# Patient Record
Sex: Male | Born: 1955 | Race: White | Hispanic: No | Marital: Married | Smoking: Light tobacco smoker
Health system: Southern US, Community
[De-identification: ages and names within clinical notes are randomized; demographics above are authoritative.]

## PROBLEM LIST (undated history)

## (undated) DIAGNOSIS — Z9119 Patient's noncompliance with other medical treatment and regimen: Secondary | ICD-10-CM

## (undated) DIAGNOSIS — K219 Gastro-esophageal reflux disease without esophagitis: Secondary | ICD-10-CM

## (undated) DIAGNOSIS — Z91199 Patient's noncompliance with other medical treatment and regimen due to unspecified reason: Secondary | ICD-10-CM

## (undated) DIAGNOSIS — R079 Chest pain, unspecified: Secondary | ICD-10-CM

## (undated) DIAGNOSIS — I10 Essential (primary) hypertension: Secondary | ICD-10-CM

## (undated) DIAGNOSIS — E785 Hyperlipidemia, unspecified: Secondary | ICD-10-CM

## (undated) HISTORY — PX: NO PAST SURGERIES: SHX2092

## (undated) HISTORY — DX: Gastro-esophageal reflux disease without esophagitis: K21.9

---

## 2004-03-24 ENCOUNTER — Ambulatory Visit: Payer: Self-pay | Admitting: Family Medicine

## 2004-05-18 ENCOUNTER — Ambulatory Visit: Payer: Self-pay | Admitting: Family Medicine

## 2004-06-21 ENCOUNTER — Ambulatory Visit: Payer: Self-pay | Admitting: Family Medicine

## 2004-09-20 ENCOUNTER — Ambulatory Visit: Payer: Self-pay | Admitting: Family Medicine

## 2009-09-21 ENCOUNTER — Telehealth: Payer: Self-pay | Admitting: Family Medicine

## 2010-04-20 NOTE — Progress Notes (Signed)
Summary: call  Phone Note Call from Patient   Caller: woman - vm Reason for Call: Talk to Doctor Summary of Call: Leaving message for Dr. Tawanna Cooler to call [phone].  Called him.  Vomited Sun & dizzy.  Dizziness continues.  He thinks it's inner ear.  Declines appointment.  Does not want to come to office without knowing what Dr. Karie Schwalbe would recommend.   Initial call taken by: Rudy Jew, RN,  September 21, 2009 3:13 PM

## 2013-10-16 ENCOUNTER — Encounter (HOSPITAL_COMMUNITY): Payer: Self-pay | Admitting: Emergency Medicine

## 2013-10-16 ENCOUNTER — Emergency Department (HOSPITAL_COMMUNITY): Payer: BC Managed Care – PPO

## 2013-10-16 ENCOUNTER — Observation Stay (HOSPITAL_COMMUNITY)
Admission: EM | Admit: 2013-10-16 | Discharge: 2013-10-17 | Disposition: A | Discharge status: Home / Self Care | Payer: BC Managed Care – PPO | Attending: Family Medicine | Admitting: Family Medicine

## 2013-10-16 DIAGNOSIS — I160 Hypertensive urgency: Secondary | ICD-10-CM

## 2013-10-16 DIAGNOSIS — E785 Hyperlipidemia, unspecified: Secondary | ICD-10-CM | POA: Diagnosis present

## 2013-10-16 DIAGNOSIS — R9431 Abnormal electrocardiogram [ECG] [EKG]: Secondary | ICD-10-CM

## 2013-10-16 DIAGNOSIS — Z79899 Other long term (current) drug therapy: Secondary | ICD-10-CM | POA: Diagnosis not present

## 2013-10-16 DIAGNOSIS — I10 Essential (primary) hypertension: Secondary | ICD-10-CM

## 2013-10-16 DIAGNOSIS — I200 Unstable angina: Secondary | ICD-10-CM

## 2013-10-16 DIAGNOSIS — R209 Unspecified disturbances of skin sensation: Secondary | ICD-10-CM | POA: Insufficient documentation

## 2013-10-16 DIAGNOSIS — R079 Chest pain, unspecified: Principal | ICD-10-CM

## 2013-10-16 DIAGNOSIS — G4700 Insomnia, unspecified: Secondary | ICD-10-CM | POA: Diagnosis not present

## 2013-10-16 DIAGNOSIS — I2089 Other forms of angina pectoris: Secondary | ICD-10-CM | POA: Insufficient documentation

## 2013-10-16 DIAGNOSIS — R0602 Shortness of breath: Secondary | ICD-10-CM | POA: Diagnosis not present

## 2013-10-16 DIAGNOSIS — Z9119 Patient's noncompliance with other medical treatment and regimen: Secondary | ICD-10-CM

## 2013-10-16 DIAGNOSIS — Z91199 Patient's noncompliance with other medical treatment and regimen due to unspecified reason: Secondary | ICD-10-CM

## 2013-10-16 DIAGNOSIS — R42 Dizziness and giddiness: Secondary | ICD-10-CM | POA: Insufficient documentation

## 2013-10-16 DIAGNOSIS — I208 Other forms of angina pectoris: Secondary | ICD-10-CM | POA: Insufficient documentation

## 2013-10-16 DIAGNOSIS — R0609 Other forms of dyspnea: Secondary | ICD-10-CM

## 2013-10-16 DIAGNOSIS — Z716 Tobacco abuse counseling: Secondary | ICD-10-CM

## 2013-10-16 HISTORY — DX: Patient's noncompliance with other medical treatment and regimen: Z91.19

## 2013-10-16 HISTORY — DX: Essential (primary) hypertension: I10

## 2013-10-16 HISTORY — DX: Hyperlipidemia, unspecified: E78.5

## 2013-10-16 HISTORY — DX: Chest pain, unspecified: R07.9

## 2013-10-16 HISTORY — DX: Patient's noncompliance with other medical treatment and regimen due to unspecified reason: Z91.199

## 2013-10-16 LAB — CBC
HEMATOCRIT: 45.1 % (ref 39.0–52.0)
Hemoglobin: 16.3 g/dL (ref 13.0–17.0)
MCH: 31 pg (ref 26.0–34.0)
MCHC: 36.1 g/dL — AB (ref 30.0–36.0)
MCV: 85.7 fL (ref 78.0–100.0)
Platelets: 191 10*3/uL (ref 150–400)
RBC: 5.26 MIL/uL (ref 4.22–5.81)
RDW: 12.5 % (ref 11.5–15.5)
WBC: 7.4 10*3/uL (ref 4.0–10.5)

## 2013-10-16 LAB — I-STAT TROPONIN, ED
Troponin i, poc: 0 ng/mL (ref 0.00–0.08)
Troponin i, poc: 0.01 ng/mL (ref 0.00–0.08)

## 2013-10-16 LAB — BASIC METABOLIC PANEL
Anion gap: 15 (ref 5–15)
BUN: 15 mg/dL (ref 6–23)
CO2: 24 mEq/L (ref 19–32)
CREATININE: 1 mg/dL (ref 0.50–1.35)
Calcium: 9.5 mg/dL (ref 8.4–10.5)
Chloride: 103 mEq/L (ref 96–112)
GFR calc Af Amer: >90 mL/min (ref >90)
GFR, EST NON AFRICAN AMERICAN: 80 mL/min — AB (ref >90)
GLUCOSE: 95 mg/dL (ref 70–99)
POTASSIUM: 3.5 meq/L — AB (ref 3.7–5.3)
Sodium: 142 mEq/L (ref 137–147)

## 2013-10-16 LAB — TROPONIN I

## 2013-10-16 LAB — PRO B NATRIURETIC PEPTIDE: Pro B Natriuretic peptide (BNP): 67.6 pg/mL (ref 0–125)

## 2013-10-16 MED ORDER — HYDRALAZINE HCL 20 MG/ML IJ SOLN: 5.0000 mg | INTRAMUSCULAR | Status: DC | PRN

## 2013-10-16 MED ORDER — NITROGLYCERIN 2 % TD OINT
1.0000 [in_us] | TOPICAL_OINTMENT | Freq: Four times a day (QID) | TRANSDERMAL | Status: DC
  Filled 2013-10-16: qty 30

## 2013-10-16 MED ORDER — METOPROLOL TARTRATE 25 MG PO TABS
25.0000 mg | ORAL_TABLET | Freq: Two times a day (BID) | ORAL | Status: DC
  Administered 2013-10-16: 25 mg via ORAL
  Filled 2013-10-16 (×3): qty 1

## 2013-10-16 MED ORDER — ENOXAPARIN SODIUM 40 MG/0.4ML ~~LOC~~ SOLN
40.0000 mg | Freq: Every day | SUBCUTANEOUS | Status: DC
  Administered 2013-10-16: 40 mg via SUBCUTANEOUS
  Filled 2013-10-16 (×2): qty 0.4

## 2013-10-16 MED ORDER — NITROGLYCERIN 2 % TD OINT
1.0000 [in_us] | TOPICAL_OINTMENT | Freq: Four times a day (QID) | TRANSDERMAL | Status: DC
  Filled 2013-10-16: qty 1

## 2013-10-16 MED ORDER — AMLODIPINE BESYLATE 10 MG PO TABS
10.0000 mg | ORAL_TABLET | Freq: Every day | ORAL | Status: DC
  Filled 2013-10-16: qty 1

## 2013-10-16 MED ORDER — LISINOPRIL 10 MG PO TABS
10.0000 mg | ORAL_TABLET | Freq: Every day | ORAL | Status: DC
  Filled 2013-10-16: qty 1

## 2013-10-16 MED ORDER — ASPIRIN 81 MG PO CHEW
324.0000 mg | CHEWABLE_TABLET | Freq: Once | ORAL | Status: AC
  Administered 2013-10-16: 324 mg via ORAL
  Filled 2013-10-16: qty 4

## 2013-10-16 NOTE — ED Provider Notes (Signed)
CSN: [id number]     Arrival date & time 10/16/13  1734 History   First MD Initiated Contact with Patient 10/16/13 1822     Chief Complaint  Patient presents with  . Chest Pain    HPI Pt noticed that his blood pressure was elevated when he woke up this morning.  He took his regular medications.  As the day progressed he took his blood pressure again and it was elevated.  He then noticed a discomfort in the left axillary region around 4pm.  He decided to take an old blood pressure medication, amlodipine but that still didn't help.  He started to feel a little short of breath as well.  He has noticed some decreased exercise tolerance today but generally walks regularly and has no discomfort associated with that.    No history of heart disease in the immediate family. Past Medical History  Diagnosis Date  . Hypertension    History reviewed. No pertinent past surgical history. History reviewed. No pertinent family history. History  Substance Use Topics  . Smoking status: Never Smoker   . Smokeless tobacco: Not on file  . Alcohol Use: 4.2 oz/week    7 Glasses of wine per week    Review of Systems  Neurological: Positive for light-headedness.       Some numbness left face   All other systems reviewed and are negative.     Allergies  Review of patient's allergies indicates no known allergies.  Home Medications   Prior to Admission medications   Medication Sig Start Date End Date Taking? Authorizing Provider  lisinopril (PRINIVIL,ZESTRIL) 10 MG tablet Take 10 mg by mouth daily.   Yes Historical Provider, MD  Multiple Vitamins-Minerals (MULTIVITAMIN PO) Take 1 tablet by mouth daily.   Yes Historical Provider, MD   BP 181/100  Pulse 65  Temp(Src) 97.6 F (36.4 C) (Oral)  Resp 13  Ht 5\' 7"  (1.702 m)  Wt 184 lb (83.462 kg)  BMI 28.81 kg/m2  SpO2 95% Physical Exam  Nursing note and vitals reviewed. Constitutional: He is oriented to person, place, and time. He appears  well-developed and well-nourished. No distress.  HENT:  Head: Normocephalic and atraumatic.  Right Ear: External ear normal.  Left Ear: External ear normal.  Mouth/Throat: Oropharynx is clear and moist.  Eyes: Conjunctivae are normal. Right eye exhibits no discharge. Left eye exhibits no discharge. No scleral icterus.  Neck: Neck supple. No tracheal deviation present.  Cardiovascular: Normal rate, regular rhythm and intact distal pulses.   Pulmonary/Chest: Effort normal and breath sounds normal. No stridor. No respiratory distress. He has no wheezes. He has no rales.  Abdominal: Soft. Bowel sounds are normal. He exhibits no distension. There is no tenderness. There is no rebound and no guarding.  Musculoskeletal: He exhibits no edema and no tenderness.  Neurological: He is alert and oriented to person, place, and time. He has normal strength. No cranial nerve deficit (No facial droop, extraocular movements intact, tongue midline ) or sensory deficit. He exhibits normal muscle tone. He displays no seizure activity. Coordination normal.  No pronator drift bilateral upper extrem, able to hold both legs off bed for 5 seconds, sensation intact in all extremities, no visual field cuts, no left or right sided neglect, normal finger-nose exam bilaterally, no nystagmus noted   Skin: Skin is warm and dry. No rash noted.  Psychiatric: He has a normal mood and affect.    ED Course  Procedures (including critical care time) Labs Review  Labs Reviewed  CBC - Abnormal; Notable for the following:    MCHC 36.1 (*)    All other components within normal limits  BASIC METABOLIC PANEL - Abnormal; Notable for the following:    Potassium 3.5 (*)    GFR calc non Af Amer 80 (*)    All other components within normal limits  PRO B NATRIURETIC PEPTIDE  I-STAT TROPOININ, ED    Imaging Review Dg Chest 2 View  10/16/2013   CLINICAL DATA:  Chest pain and shortness of breath which began earlier today. Current  history of hypertension.  EXAM: CHEST  2 VIEW  COMPARISON:  None.  FINDINGS: Cardiomediastinal silhouette unremarkable. Lungs clear. Bronchovascular markings normal. Pulmonary vascularity normal. No visible pleural effusions. No pneumothorax. Degenerative changes involving the thoracic spine.  IMPRESSION: No acute cardiopulmonary disease.   Electronically Signed   By: Hulan Saas M.D.   On: 10/16/2013 19:17   Ct Head Wo Contrast  10/16/2013   CLINICAL DATA:  Episode of left facial numbness earlier today. Patient also presented with left-sided chest pain, shortness of breath and nausea.  EXAM: CT HEAD WITHOUT CONTRAST  TECHNIQUE: Contiguous axial images were obtained from the base of the skull through the vertex without intravenous contrast.  COMPARISON:  None.  FINDINGS: Ventricular system normal in size and appearance for age. No mass lesion. No midline shift. No acute hemorrhage or hematoma. No extra-axial fluid collections. No evidence of acute infarction. No focal brain parenchymal abnormalities.  No focal osseous abnormalities involving the skull. Mucous retention cyst or polyp in the left maxillary sinus. Remaining paranasal sinuses, bilateral mastoid air cells, and bilateral middle ear cavities well-aerated.  IMPRESSION: 1. Normal intracranially. 2. Mild chronic left maxillary sinus disease.   Electronically Signed   By: Hulan Saas M.D.   On: 10/16/2013 20:09     EKG Interpretation   Date/Time:  Friday October 16 2013 17:38:19 EDT Ventricular Rate:  74 PR Interval:  146 QRS Duration: 88 QT Interval:  442 QTC Calculation: 490 R Axis:   53 Text Interpretation:  Sinus rhythm with marked sinus arrhythmia  Nonspecific T wave abnormality Prolonged QT Abnormal ECG No previous  tracing Confirmed by Darrius Montano  MD-J, Alanny Rivers ([id number]) on 10/16/2013 6:23:37 PM     Medications  nitroGLYCERIN (NITROGLYN) 2 % ointment 1 inch (not administered)    MDM   Pt's blood pressure has improved from his  initial BP on arrival.  BP 160s during my exam.  BP increased again into the 180s.  Will start ntg paste.    Pt's concerning for possible ACS.  He has htn.  Noticed fatigue and decreased exercise tolerance today.   Will consult with medical service regarding admission.    Linwood Dibbles, MD 10/16/13 2023

## 2013-10-16 NOTE — H&P (Signed)
[facility] Admission History and Physical Service Pager: [phone]  Patient name: Cameron Sullivan Medical record number: [id number] Date of birth: 1955/10/09 Age: 58 y.o. Gender: male  Primary Care Provider: Pcp Not In System Consultants: Cardiology to assess for appropriate consultation Code Status: Full  Chief Complaint: Chest tightness and dyspnea  Assessment and Plan: Cameron Sullivan is a 58 y.o. male presenting with chest tightness with associated dyspnea. PMH is significant for HTN and cigar smoking.  Unstable angina: Chest "tightness" at L midaxillary line associated with new-onset dyspnea worse with exertion, improved, but not resolved by rest. ECG with T wave flattening in septal leads and no priors for comparison. HEART score: 4 (1 history, 1 ECG, 1 age, 1RFs). Wells score: 0 indicating low probability of PE. No signs of CHF. XR is clear.  - Admit to FMTS, Dr. McDiarmid attending, on telemetry - Repeat ECG in AM (non-specific T wave changes and sub-diagnostic signs of LVH in ED) - Cycle troponins (1st is negative) - Euvolemic, pro-BNP: 67.6 - Lipid panel, Hb A1c, TSH - O2 by [place] prn hypoxemia - Will benefit from stress test, either inpatient or outpatient  Hypertensive urgency (Severe asymptomatic HTN): BP 202/93 on arrival with no signs of acute end organ damage. Suspect due to non-compliance with contribution of anxiety. No further severe-range BP's since arrival. - Resume lisinopril 10mg  daily (Reinforce need for daily use regardless of somatic measurement of BP) - Restart norvasc 10mg  daily - Hydralazine 5mg  IV prn SBP >137mmHg or DBP >117mmHg  Insomnia: Chronic problem, with daytime drowsiness and wife reporting intermittent apneic episodes and snoring; BMI is 28 and Pt drinks wine nightly, increasing risk of OSA.  - Recommend sleep study as outpatient.   Concern for CVA: Resolved. Symptoms are reported as chronic and are no longer present. CT  head reassuring. Will continue to monitor for symptoms.   FEN/GI: Saline lock IV, heart healthy diet Ppx: Lovenox  Disposition: Admit for observation and rule out of ACS.   History of Present Illness: Cameron Sullivan is a 58 y.o. male presenting with chest "tightness" and new shortness of breath.   Mr. Sh[place]sberry reports experiencing chest tightness this afternoon on the far left side of his chest specifically, not related to position or respiration, non-radiating, somewhat improved by rest. This is associated with dyspnea, which he has never before experienced, and which is worse on exertion. He reports being able to feel when his blood pressure is elevated with high accuracy and so he checked it and it was 160/90 which made him anxious. He makes note of a chronic left sided-numbness on his neck which has intermittently been noticed for over 15 years. It is not particularly bothersome usually, but in light of this blood pressure he and his wife feared for a stroke. They deny any focal weakness, other sensory deficits, imbalance, and abnormal speech. He took his lisinopril (which he's been taking on an as needed basis) which did not improve his blood pressure. So he took a tablet of amlodipine which is from a previous prescription and had expired in 2014. This also didn't help, so he called his primary care office (Dr. Dalene Carrow in [place], [place]) who suggested he come to the [facility] ED.   Initial BP here was 202/93, which improved with no intervention. He reports the tightness has resolved but he remains short of breath even while resting and conversing. He exercises nearly every day, eats a fruit/vegetable-based diet without added salt, drinks one  glass of red wine and eats a square of dark chocolate nightly to improve his health. They (he and his wife) are very wary of taking medications and he is skeptical of the need for admission for observation.  He smokes one cigar per week for "many years" but does not  smoke cigarettes, denies illicit drugs, and drinks only red wine, one glass per night. No FH of early CAD. Cannot recall having high cholesterol, but denies being on statin.     Denies fever, chills, weight loss, recent illness, changes in vision or hearing, headache, cough, sore throat, palpitations, abdominal pain, nausea, vomiting, changes in bowel habits, blood in stool, change in bladder habits, myalgias, arthralgias, and rash.    Review Of Systems: Per HPI Otherwise 12 point review of systems was performed and was unremarkable.  There are no active problems to display for this patient.  Past Medical History: Past Medical History  Diagnosis Date  . Hypertension    Past Surgical History: History reviewed. No pertinent past surgical history. Social History: History  Substance Use Topics  . Smoking status: Never Smoker   . Smokeless tobacco: Not on file  . Alcohol Use: 4.2 oz/week    7 Glasses of wine per week   Additional social history: as above Please also refer to relevant sections of EMR.  Family History: History reviewed. No pertinent family history.  Allergies and Medications: No Known Allergies Lisinopril 10mg  daily Multivitamin daily  Objective: BP 181/100  Pulse 65  Temp(Src) 97.6 F (36.4 C) (Oral)  Resp 13  Ht 5\' 7"  (1.702 m)  Wt 184 lb (83.462 kg)  BMI 28.81 kg/m2  SpO2 95% Exam: General: Pleasant, cooperative, talkative 58 y.o. male sitting in a chair in NAD HEENT: NCAT, PERRL, oropharynx clear, MMM Cardiovascular: Regular rate, no murmur, rub, or gallop; no JVD or LE edema; 2+ DP and radial pulses. Respiratory: Non-labored with normal rate when resting; takes frequent breaths when speaking, but speaks in full sentences. Clear lung fields throughout.  Abdomen: +BS, soft, NT, ND, no hepatomegaly Extremities: Warm and well perfused without obvious deformity Skin: No rashes, wounds, or masses Neuro: Alert and oriented, CN II-XII intact grossly, speech  normal, gait normal. Strength 5/5 throughout, sensation intact to light touch throughout, romberg negative, no pronator drift, dysmetria or dysdiadochokinesia.   Labs and Imaging: CBC BMET   Recent Labs Lab 10/16/13 1833  WBC 7.4  HGB 16.3  HCT 45.1  PLT 191    Recent Labs Lab 10/16/13 1833  NA 142  K 3.5*  CL 103  CO2 24  BUN 15  CREATININE 1.00  GLUCOSE 95  CALCIUM 9.5     Troponin i, poc 0.00    (BNP)  67.6   CT HEAD WITHOUT CONTRAST  FINDINGS:  Ventricular system normal in size and appearance for age. No mass  lesion. No midline shift. No acute hemorrhage or hematoma. No  extra-axial fluid collections. No evidence of acute infarction. No  focal brain parenchymal abnormalities.  No focal osseous abnormalities involving the skull. Mucous retention  cyst or polyp in the left maxillary sinus. Remaining paranasal  sinuses, bilateral mastoid air cells, and bilateral middle ear  cavities well-aerated.  IMPRESSION:  1. Normal intracranially.  2. Mild chronic left maxillary sinus disease.  CXR: No acute abnormality ECG: regular rate, NSR, normal axis, T wave flattening in septal leads, QTc 490msec, no prior for comparison.   Hazeline Junkeryan Grunz, MD 10/16/2013, 8:23 PM PGY-2, [facility] Intern  pager: [phone], text pages welcome

## 2013-10-16 NOTE — ED Notes (Signed)
Pt expressing fears about staying overnight.  Stating "i don't know if I need to stay.  Hospitals scare me.  I feel like I am going to go crazy if I stay worrying about my wife and dogs".  EDP at bedside to talk to pt about fears.  Admitting MD at bedside as well to talk to pt.

## 2013-10-16 NOTE — ED Notes (Signed)
Pt transported to CT ?

## 2013-10-16 NOTE — ED Notes (Signed)
Pt presents with new onset on Left side chest pain starting 2 hours ago, associated with SOB and nausea, no other associated symptoms. Pt states his pain is an "annoying discomfort." Pt denies anything makes it worse or better

## 2013-10-16 NOTE — ED Notes (Signed)
EDP at bedside  

## 2013-10-17 ENCOUNTER — Other Ambulatory Visit: Payer: Self-pay | Admitting: Nurse Practitioner

## 2013-10-17 ENCOUNTER — Encounter (HOSPITAL_COMMUNITY): Payer: Self-pay | Admitting: Nurse Practitioner

## 2013-10-17 DIAGNOSIS — Z91199 Patient's noncompliance with other medical treatment and regimen due to unspecified reason: Secondary | ICD-10-CM | POA: Diagnosis not present

## 2013-10-17 DIAGNOSIS — R0609 Other forms of dyspnea: Secondary | ICD-10-CM

## 2013-10-17 DIAGNOSIS — R9431 Abnormal electrocardiogram [ECG] [EKG]: Secondary | ICD-10-CM

## 2013-10-17 DIAGNOSIS — I10 Essential (primary) hypertension: Secondary | ICD-10-CM

## 2013-10-17 DIAGNOSIS — R0989 Other specified symptoms and signs involving the circulatory and respiratory systems: Secondary | ICD-10-CM

## 2013-10-17 DIAGNOSIS — Z9119 Patient's noncompliance with other medical treatment and regimen: Secondary | ICD-10-CM

## 2013-10-17 DIAGNOSIS — R0600 Dyspnea, unspecified: Secondary | ICD-10-CM

## 2013-10-17 DIAGNOSIS — R0789 Other chest pain: Secondary | ICD-10-CM

## 2013-10-17 DIAGNOSIS — I200 Unstable angina: Secondary | ICD-10-CM

## 2013-10-17 DIAGNOSIS — Z7189 Other specified counseling: Secondary | ICD-10-CM

## 2013-10-17 DIAGNOSIS — E785 Hyperlipidemia, unspecified: Secondary | ICD-10-CM

## 2013-10-17 DIAGNOSIS — R079 Chest pain, unspecified: Secondary | ICD-10-CM

## 2013-10-17 LAB — TROPONIN I
Troponin I: <0.3 ng/mL (ref <0.30)
Troponin I: <0.3 ng/mL (ref <0.30)

## 2013-10-17 LAB — LIPID PANEL
Cholesterol: 213 mg/dL — ABNORMAL HIGH (ref 0–200)
HDL: 54 mg/dL (ref >39)
LDL CALC: 122 mg/dL — AB (ref 0–99)
Total CHOL/HDL Ratio: 3.9 RATIO
Triglycerides: 184 mg/dL — ABNORMAL HIGH (ref <150)
VLDL: 37 mg/dL (ref 0–40)

## 2013-10-17 LAB — TSH: TSH: 1.89 u[IU]/mL (ref 0.350–4.500)

## 2013-10-17 LAB — HEMOGLOBIN A1C
Hgb A1c MFr Bld: 5.4 % (ref <5.7)
MEAN PLASMA GLUCOSE: 108 mg/dL (ref <117)

## 2013-10-17 MED ORDER — ASPIRIN 81 MG PO TABS: 81.0000 mg | ORAL_TABLET | Freq: Every day | ORAL | Status: DC

## 2013-10-17 MED ORDER — AMLODIPINE BESYLATE 10 MG PO TABS
10.0000 mg | ORAL_TABLET | Freq: Every day | ORAL | Status: DC
  Administered 2013-10-17: 10 mg via ORAL
  Filled 2013-10-17: qty 1

## 2013-10-17 MED ORDER — METOPROLOL TARTRATE 25 MG PO TABS
25.0000 mg | ORAL_TABLET | Freq: Two times a day (BID) | ORAL | Status: DC
  Administered 2013-10-17: 25 mg via ORAL
  Filled 2013-10-17 (×2): qty 1

## 2013-10-17 MED ORDER — ATORVASTATIN CALCIUM 40 MG PO TABS: 40.0000 mg | ORAL_TABLET | Freq: Every day | ORAL | Status: DC

## 2013-10-17 MED ORDER — LISINOPRIL 10 MG PO TABS
10.0000 mg | ORAL_TABLET | Freq: Every day | ORAL | Status: DC
  Administered 2013-10-17: 10 mg via ORAL
  Filled 2013-10-17: qty 1

## 2013-10-17 MED ORDER — NITROGLYCERIN 0.4 MG SL SUBL: SUBLINGUAL_TABLET | SUBLINGUAL | Status: DC

## 2013-10-17 MED ORDER — AMLODIPINE BESYLATE 10 MG PO TABS: 5.0000 mg | ORAL_TABLET | Freq: Every day | ORAL | Status: DC

## 2013-10-17 NOTE — Consult Note (Signed)
CARDIOLOGY CONSULT NOTE   Patient ID: Cameron Sullivan MRN: [id number], DOB/AGE: [birth year]   Admit date: 10/16/2013 Date of Consult: 10/17/2013  Primary Physician: Dr. Dalene Carrow Primary Cardiologist: new to    Pt. Profile  58 y/o male with a h/o HTN and medication noncompliance who presented to the ED yesterday with c/p and HTN urgency.  Problem List  Past Medical History  Diagnosis Date  . Hypertension   . Hyperlipidemia   . Chest pain   . Noncompliance     History reviewed. No pertinent past surgical history.   Allergies  No Known Allergies  HPI   57 y/o male w/o prior cardiac hx.  He was dx with HTN 2 yrs ago and was placed on lisinopril and norvasc.  He didn't feel good with BP's below 130 and so he stopped his norvasc and started taking lisinopril prn only.  If his BP is > 140, he'll usually take a lisinopril.  He exercises regularly and generally feels well.  About 2 wks ago, he was blowing leaves out of his gutter using a blower with a back pack and fell off of his roof onto his steps - a 12 foot drop.  He landed on his bottom and the back pack/blower took the brunt of the fall.  He had no significant trauma and did not seek care.    He recently f/u with his PCP and his BP was elevated.  His PCP refilled his lisinopril and advised that he take it daily.  He's continued to take it prn.  Yesterday, his BP was 148/100 when he woke up.  He took one of his old lisinopril tabs (exp 06/2012) w/o change in BP.  At that point he became anxious and BP continued to elevate as high as 200 systolic.  He also noted mild air hunger and left axillary chest pain.  He presented to the ED where his BP was 202/93.  He was treated with NTP and PO metoprolol with improvement in BP.  He says that axillary pain lasted a few hrs and resolved spontaneously.  ECG was non-acute and CE neg.  He's been placed back on lisinopril and amlodipine and pressures are overall improved.  He has not had any further chest  pain.  He is eager to go home and is agreeable to an outpt stress test.  Inpatient Medications  . amLODipine  10 mg Oral Daily  . enoxaparin (LOVENOX) injection  40 mg Subcutaneous QHS  . lisinopril  10 mg Oral Daily  . metoprolol tartrate  25 mg Oral BID  . nitroGLYCERIN  1 inch Topical 4 times per day    Family History Family History  Problem Relation Age of Onset  . Hypertension Father   . Cancer Father     died @ 55  . Hypertension Mother     alive in her 26s  . Alzheimer's disease Mother   . Hyperlipidemia Brother      Social History History   Social History  . Marital Status: Married    Spouse Name: N/A    Number of Children: N/A  . Years of Education: N/A   Occupational History  . Not on file.   Social History Main Topics  . Smoking status: Current Some Day Smoker  . Smokeless tobacco: Not on file     Comment: smokes 1 cigar/wk x many years.  . Alcohol Use: 4.2 oz/week    7 Glasses of wine per week  . Drug Use: No  .  Sexual Activity: Not on file   Other Topics Concern  . Not on file   Social History Narrative   Lives in [place] with wife.  Exercises regularly.     Review of Systems  General:  No chills, fever, night sweats or weight changes.  Cardiovascular:  +++ left axillary chest pain associated with mild dyspnea/air hunger prior to admission.  No dyspnea on exertion, edema, orthopnea, palpitations, paroxysmal nocturnal dyspnea. Dermatological: No rash, lesions/masses Respiratory: No cough Urologic: No hematuria, dysuria Abdominal:   No nausea, vomiting, diarrhea, bright red blood per rectum, melena, or hematemesis Neurologic:  No visual changes, wkns, changes in mental status. All other systems reviewed and are otherwise negative except as noted above.  Physical Exam  Blood pressure 148/83, pulse 79, temperature 97.9 F (36.6 C), temperature source Oral, resp. rate 20, height 5\' 7"  (1.702 m), weight 186 lb (84.369 kg), SpO2 95.00%.    General: Pleasant, NAD Psych: Normal affect. Neuro: Alert and oriented X 3. Moves all extremities spontaneously. HEENT: Normal  Neck: Supple without bruits or JVD. Lungs:  Resp regular and unlabored, CTA. Heart: RRR no s3, s4, or murmurs. Abdomen: Soft, non-tender, non-distended, BS + x 4.  Extremities: No clubbing, cyanosis or edema. DP/PT/Radials 2+ and equal bilaterally.  Labs   Recent Labs  10/16/13 2238 10/17/13 0409 10/17/13 1000  TROPONINI <0.30 <0.30 <0.30   Lab Results  Component Value Date   WBC 7.4 10/16/2013   HGB 16.3 10/16/2013   HCT 45.1 10/16/2013   MCV 85.7 10/16/2013   PLT 191 10/16/2013     Recent Labs Lab 10/16/13 1833  NA 142  K 3.5*  CL 103  CO2 24  BUN 15  CREATININE 1.00  CALCIUM 9.5  GLUCOSE 95   Lab Results  Component Value Date   CHOL 213* 10/17/2013   HDL 54 10/17/2013   LDLCALC 604* 10/17/2013   TRIG 184* 10/17/2013   Radiology/Studies  Dg Chest 2 View  10/16/2013   CLINICAL DATA:  Chest pain and shortness of breath which began earlier today. Current history of hypertension.  EXAM: CHEST  2 VIEW  IMPRESSION: No acute cardiopulmonary disease.   Electronically Signed   By: Hulan Saas M.D.   On: 10/16/2013 19:17   Ct Head Wo Contrast  10/16/2013   CLINICAL DATA:  Episode of left facial numbness earlier today. Patient also presented with left-sided chest pain, shortness of breath and nausea.  EXAM: CT HEAD WITHOUT CONTRAST   IMPRESSION: 1. Normal intracranially. 2. Mild chronic left maxillary sinus disease.   Electronically Signed   By: Hulan Saas M.D.   On: 10/16/2013 20:09   ECG  Sinus arrhythmia, 74, lvh.  ASSESSMENT AND PLAN  1.  Left Axillary Chest Pain:  Pt developed left sided c/p associated with mild dyspnea/air hunger in the setting of htn urgency yesterday.  Despite prolonged Ss, he has not had any objective evidence of ischemia and cardiac markers have remained within normal limits.  Antihypertensive therapy has been  resumed and the importance of compliance has been reinforced.  We will arrange for outpatient echocardiogram and ETT and f/u in our office.  He may be discharged home from our standpoint.  2.  HTN Urgency:  Improved.  Stressed importance of medication adherence.    3.  Tob Abuse:  Currently smoking 1 cigar a week.  Recommended complete cessation.  4.  HL:  LDL 122.  With h/o HTN and age, risk of event in next 35  yrs calculates out to 17.9%.  Would have a low threshold to add moderate dose statin.  Signed, Nicolasa Duckinghristopher Makylie Rivere, NP 10/17/2013, 12:48 PM

## 2013-10-17 NOTE — Consult Note (Signed)
The patient was seen and examined, and I agree with the assessment and plan as documented above. Pt admitted with shortness of breath and left axillary pain, admitted with hypertensive urgency. Had a long discussion with patient with respect to effects of longstanding, untreated hypertension such as MI, CVA, arrhythmias, renal disease, etc. Stressed importance of medication compliance. Currently symptomatically and hemodynamically stable. Would recommend outpatient echocardiogram to assess LV systolic and diastolic function, cavity size, and wall thickness, as well as exercise treadmill stress test. Pt and wife in agreement with this plan. Can discharge to home from my standpoint.

## 2013-10-17 NOTE — Discharge Summary (Signed)
I have seen and examined this patient. I have discussed with Dr Marsh.  I agree with their findings and plans as documented in their discharge note.    

## 2013-10-17 NOTE — Discharge Summary (Signed)
Family Medicine Teaching [facility] Discharge Summary  Patient name: Cameron Sullivan Medical record number: [id number] Date of birth: 1955/12/04 Age: 58 y.o. Gender: male Date of Admission: 10/16/2013  Date of Discharge: 10/17/13 Admitting Physician: Cameron Roachodd D McDiarmid, MD  Primary Care Provider: Pcp Not In System Consultants: Cardiology  Indication for Hospitalization: Hypertensive urgency, chest pain evaluation.  Discharge Diagnoses/Problem List:  Patient Active Problem List   Diagnosis Date Noted  . Angina at rest 10/16/2013  . Hypertensive urgency 10/16/2013  . Insomnia 10/16/2013  . HTN (hypertension) 10/16/2013  . History of noncompliance with medical treatment 10/16/2013   Disposition: Discharged home  Discharge Condition: Stable, improved  Discharge Exam:  General: Pleasant, talkative 58 y.o. male in NAD  Cardiovascular: Regular rate, no murmur, rub, or gallop; no JVD or LE edema; 2+ DP and radial pulses.  Respiratory: Non-labored with normal rate when resting. Clear lung fields throughout.   Brief Hospital Course:  Cameron Sullivan is a 58 y.o. male presenting 7/31 with chest tightness with associated dyspnea, found to have hypertensive urgency. His medical history is significant for HTN and cigar smoking.   Cameron Sullivan described chest "tightness" at the left anterior axillary line associated with new-onset dyspnea worse with exertion, and improved, but not resolved, with rest. This occurred earlier on the morning of admission after the patient failed to take any anti-hypertensives because he dislikes pills and has been eating a DASH diet, exercising regularly, drinking red wine and eating dark chocolate nightly to improve his health.   ECG initially showed T wave flattening in septal leads and no priors for comparison and troponin was negative. HEART score was 4, Wells score was zero, there were no acute findings on chest x-ray and he was euvolemic. Repeat ECG the following  morning showed resolution of non-specific T wave changes, cardiac enzymes remained negative, and blood pressure was fairly well controlled with re-initiation of lisinopril 10mg  and re-starting amlodipine 5mg . Cardiology consultant recommended outpt myoview and close f/up in addition to mod dose statin given ascvd risk of 17.9%. He remained hemodynamically stable and was discharged on 10/17/2013. Aspirin 81mg  was prescribed at discharge, as was sub-lingual nitroglycerin and ator 40mg     Issues for Follow Up:  - ASCVD 10-year risk based on lipid profile obtained here is >7.5% indicating he may benefit from moderate-to-high intensity statin therapy. The decision to initiate this is left to the PCP. - Follow up cardiology recommendations: outpt myoview and f/up - Recommend sleep study as outpatient.   Significant Procedures: none  Significant Labs and Imaging:   Recent Labs Lab 10/16/13 1833  WBC 7.4  HGB 16.3  HCT 45.1  PLT 191    Recent Labs Lab 10/16/13 1833  NA 142  K 3.5*  CL 103  CO2 24  GLUCOSE 95  BUN 15  CREATININE 1.00  CALCIUM 9.5   Troponin negative x3  (BNP)   67.6    CT HEAD WITHOUT CONTRAST  FINDINGS:  Ventricular system normal in size and appearance for age. No mass  lesion. No midline shift. No acute hemorrhage or hematoma. No  extra-axial fluid collections. No evidence of acute infarction. No  focal brain parenchymal abnormalities.  No focal osseous abnormalities involving the skull. Mucous retention  cyst or polyp in the left maxillary sinus. Remaining paranasal  sinuses, bilateral mastoid air cells, and bilateral middle ear  cavities well-aerated.  IMPRESSION:  1. Normal intracranially. 2. Mild chronic left maxillary sinus disease.   CXR: No acute abnormality  ECG: regular rate, NSR, normal axis, T wave flattening in septal leads, QTc , no prior for comparison.   Results/Tests Pending at Time of Discharge: None  Discharge Medications:     Medication List         amLODipine 10 MG tablet  Commonly known as:  NORVASC  Take 0.5 tablets (5 mg total) by mouth daily.     aspirin 81 MG tablet  Take 1 tablet (81 mg total) by mouth daily.     lisinopril 10 MG tablet  Commonly known as:  PRINIVIL,ZESTRIL  Take 10 mg by mouth daily.     MULTIVITAMIN PO  Take 1 tablet by mouth daily.     nitroGLYCERIN 0.4 MG SL tablet  Commonly known as:  NITROSTAT  Take 1 tab under tongue every 5 min as needed for chest pain. Call 911 if symptoms remain 5 minutes after first administration.        Discharge Instructions: Please refer to Patient Instructions section of EMR for full details.  Patient was counseled important signs and symptoms that should prompt return to medical care, changes in medications, dietary instructions, activity restrictions, and follow up appointments.   Follow-Up Appointments:     Follow-up Information   Follow up with Cameron Sullivan. (Call to schedule a hospital follow up appointment. )       Cameron Junker, MD 10/17/2013, 10:04 AM PGY-2, [facility] Family Medicine

## 2013-10-17 NOTE — Progress Notes (Signed)
Family Medicine Teaching Service Daily Progress Note Intern Pager: [phone]  Patient name: Cameron Sullivan Medical record number: [id number] Date of birth: 1955-11-18 Age: 58 y.o. Gender: male  Primary Care Provider: Pcp Not In System Consultants: Cardiology Code Status: Full  Pt Overview and Major Events to Date:  7/31: Admitted for ACS rule out  Assessment and Plan: Cameron Sullivan is a 58 y.o. male presenting with chest tightness with associated dyspnea. PMH is significant for HTN and cigar smoking.   Unstable angina: Chest "tightness" at L midaxillary line associated with new-onset dyspnea worse with exertion, improved, but not resolved by rest; certainly component of anxiety. - Repeat ECG in AM shows improvement of non-specific T wave changes. - Troponins negative - ASCVD risk > 7.5%: Defer choice Re: statin to PCP.  - Continue ASA 81mg  as outpatient - Continue NTG as outpatient  Hypertensive urgency (Severe asymptomatic HTN): Resolved.  - Continue lisinopril 10mg  daily and add back norvasc 5mg  daily  - Hydralazine 5mg  IV prn SBP >11160mmHg or DBP >13400mmHg   Insomnia: Chronic problem with daytime drowsiness and wife reporting intermittent apneic episodes and snoring; BMI is 28 and pt drinks wine nightly, increasing risk of OSA.  - Recommend sleep study as outpatient.   Concern for CVA: Resolved. Symptoms are reported as chronic and are no longer present. CT head reassuring. Will continue to monitor for symptoms.   FEN/GI: Saline lock IV, heart healthy diet  Ppx: Lovenox  Disposition: Pending cardiology evaluation and observation of BP after AM meds administration.   Subjective:  Pt denies any symptoms over night, had trouble sleeping. Perseverating on what caused this urgency, and admits that it is probably due to medical non-adherence and taking expired lisinopril and norvasc. No CP, SOB.   Objective: Temp:  [97.6 F (36.4 C)-98 F (36.7 C)] 98 F (36.7 C) (08/01  0500) Pulse Rate:  [61-76] 73 (08/01 0500) Resp:  [13-18] 18 (08/01 0500) BP: (154-224)/(86-105) 154/86 mmHg (08/01 0500) SpO2:  [95 %-100 %] 96 % (08/01 0500) Weight:  [184 lb (83.462 kg)-186 lb (84.369 kg)] 186 lb (84.369 kg) (07/31 2156) Physical Exam: General: Pleasant, cooperative, talkative 58 y.o. male in NAD  Cardiovascular: Regular rate, no murmur, rub, or gallop; no JVD or LE edema; 2+ DP and radial pulses.  Respiratory: Non-labored with normal rate when resting. Clear lung fields throughout.  Abdomen: +BS, soft, NT, ND, no hepatomegaly  Neuro: Alert and oriented, CN II-XII intact grossly, speech normal, gait normal. Nonfocal.   Laboratory:  Recent Labs Lab 10/16/13 1833  WBC 7.4  HGB 16.3  HCT 45.1  PLT 191    Recent Labs Lab 10/16/13 1833  NA 142  K 3.5*  CL 103  CO2 24  BUN 15  CREATININE 1.00  CALCIUM 9.5  GLUCOSE 95  Troponin neg x3 (BNP)   67.6   CT HEAD WITHOUT CONTRAST  FINDINGS:  Ventricular system normal in size and appearance for age. No mass  lesion. No midline shift. No acute hemorrhage or hematoma. No  extra-axial fluid collections. No evidence of acute infarction. No  focal brain parenchymal abnormalities.  No focal osseous abnormalities involving the skull. Mucous retention  cyst or polyp in the left maxillary sinus. Remaining paranasal  sinuses, bilateral mastoid air cells, and bilateral middle ear  cavities well-aerated.  IMPRESSION:  1. Normal intracranially.  2. Mild chronic left maxillary sinus disease.  CXR: No acute abnormality  ECG: regular rate, NSR, normal axis, T wave flattening in septal leads, QTc  , no prior for comparison.    Hazeline Junker, MD 10/17/2013, 8:33 AM PGY-2, [facility] Intern pager: [phone], text pages welcome

## 2013-10-17 NOTE — Discharge Instructions (Signed)
You were admitted for chest pain that may have been due to your blood pressure. To treat this we will continue lisinopril 10mg  daily and norvasc 5mg  daily (prescribed for you). You also need to take a baby aspirin (81mg , prescribed for you if you need it) and have nitroglycerin with you at all times - at least until you receive further work up per cardiology.   Make sure to take these medications daily regardless of symptoms. Contact your PCP after you are discharged to schedule a follow up appointment. Return to the emergency department if you experience worsening symptoms or new chest pain or shortness of breath.   We hope you feel better soon,  - Family Medicine Teaching Service

## 2013-10-17 NOTE — H&P (Signed)
I have seen and examined this patient. I have discussed with Dr Jarvis NewcomerGrunz.  I agree with their findings and plans as documented in their progress note.  Chest pain - HEART Score 4. - Ruling out for AMI - No further CP - Cardiology to see for opinion if outpatient CAD testing amy be safely recommended.  - Encourage patient to take his prescribed antihypertensive medications daily.  Recommend patient start a daily Aspirin 81 mg daily.  Recommend NTG SL as needed at hand until patient has had further CAD evaluation by cardiology.

## 2013-10-17 NOTE — Progress Notes (Signed)
UR Completed.  Kaylon Laroche Jane [phone] 10/17/2013  

## 2013-10-17 NOTE — Progress Notes (Signed)
I have seen and examined this patient. I have discussed with Dr Grunz.  I agree with their findings and plans as documented in their progress note.    

## 2013-10-23 ENCOUNTER — Telehealth: Payer: Self-pay | Admitting: Nurse Practitioner

## 2013-10-23 NOTE — Telephone Encounter (Signed)
Pt was scheduled for echo and treadmill on 11/13/2013, Patient cancelled pt wants to see pcp before having tests done and will call if he decides to have the test.

## 2013-11-13 ENCOUNTER — Encounter (HOSPITAL_COMMUNITY): Payer: BC Managed Care – PPO

## 2013-11-13 ENCOUNTER — Ambulatory Visit (HOSPITAL_COMMUNITY): Payer: BC Managed Care – PPO

## 2016-06-22 IMAGING — CT CT HEAD W/O CM
2 series · 16 of 30 positions shown, 18 images · non-contrast
Comparison: None.

CLINICAL DATA: Episode of left facial numbness earlier today.
Patient also presented with left-sided chest pain, shortness of
breath and nausea.

EXAM:
CT HEAD WITHOUT CONTRAST
TECHNIQUE: Contiguous axial images were obtained from the base of the skull
through the vertex without intravenous contrast.

[Series 201: head w/o, idose (1) · axial · non-contrast · 0.46mm/px · z∈[+78,+203]mm · 8 of 33 slices shown, 10 images]
[im 4/33  brain]
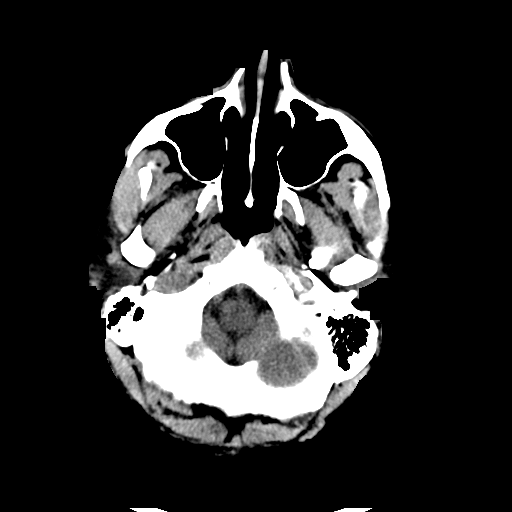
[im 4/33  bone]
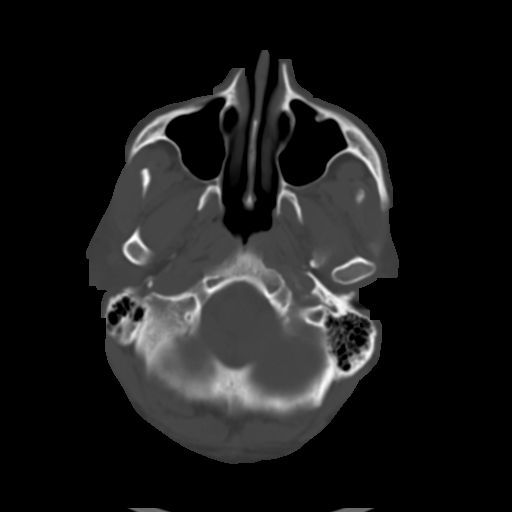
[im 8/33  brain]
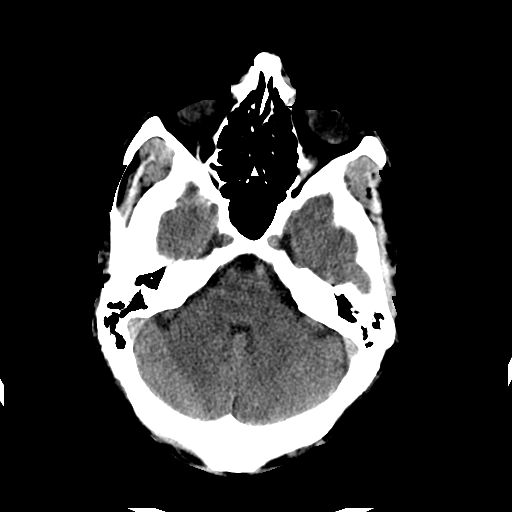
[im 11/33  brain]
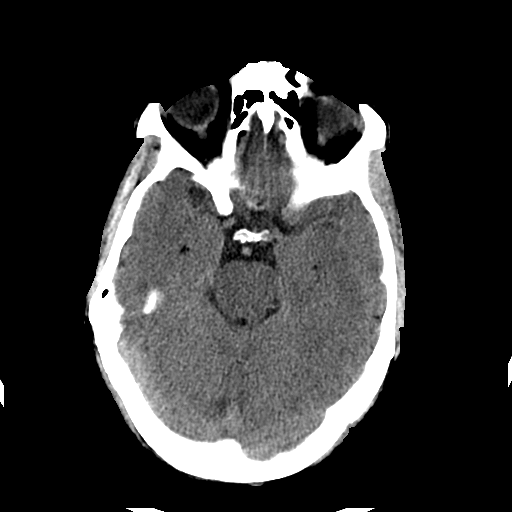
[im 15/33  brain]
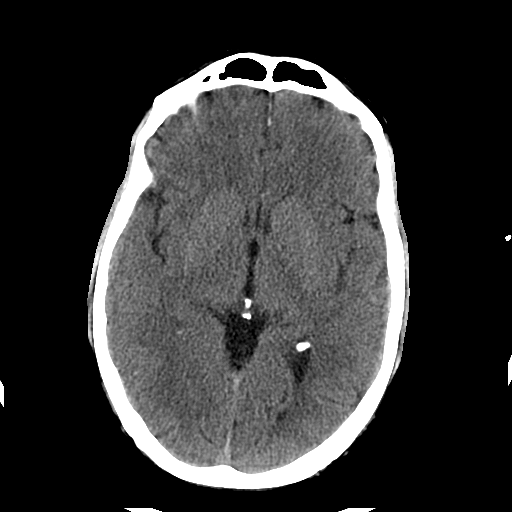
[im 18/33  brain]
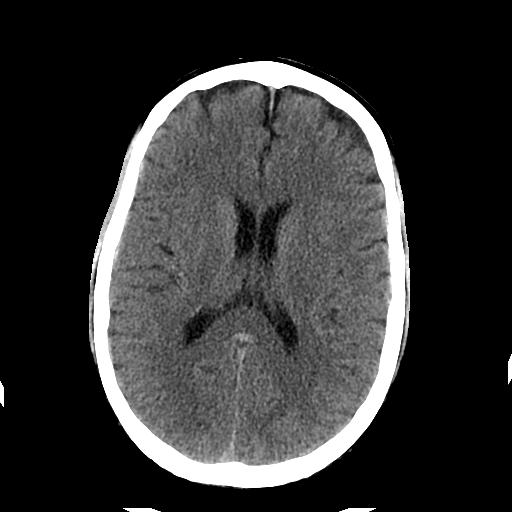
[im 18/33  bone]
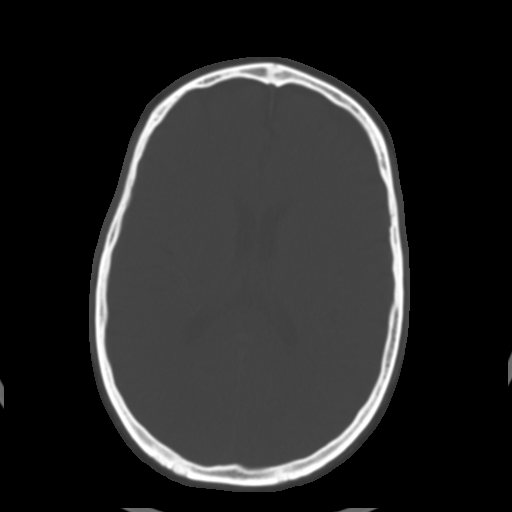
[im 22/33  brain]
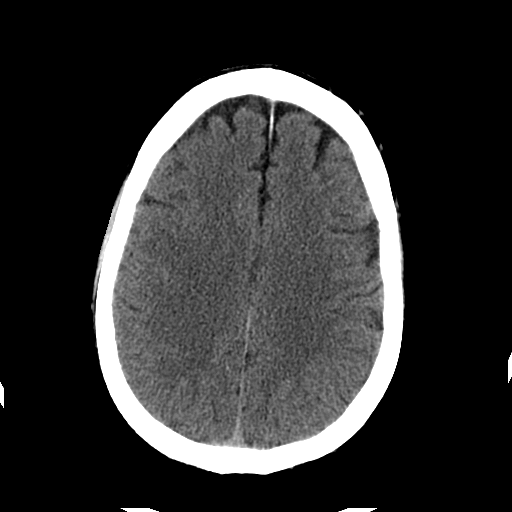
[im 25/33  brain]
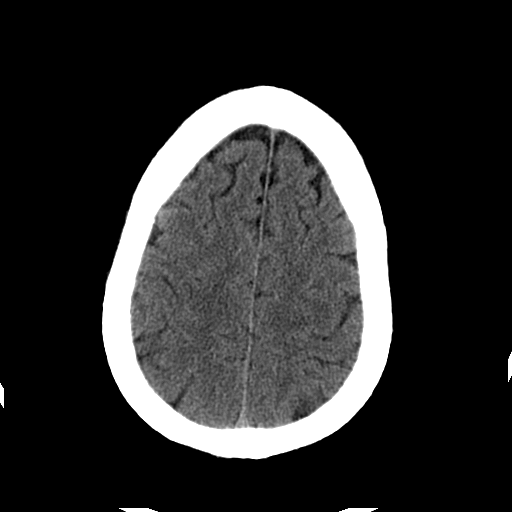
[im 29/33  brain]
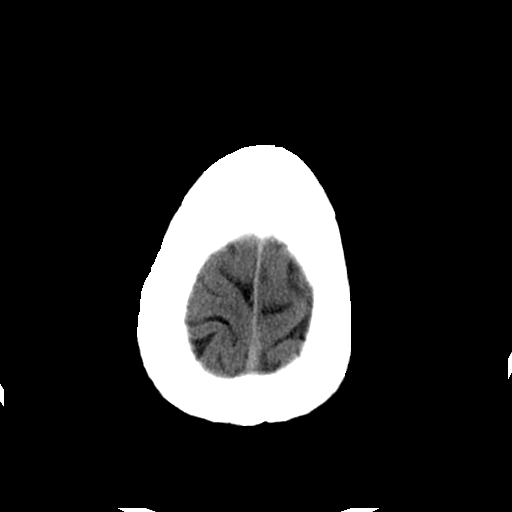

[Series 202: head w/o bone, idose (1) · axial · non-contrast · 0.46mm/px · z∈[+77,+207]mm · 8 of 66 slices shown]
[im 7/66  bone]
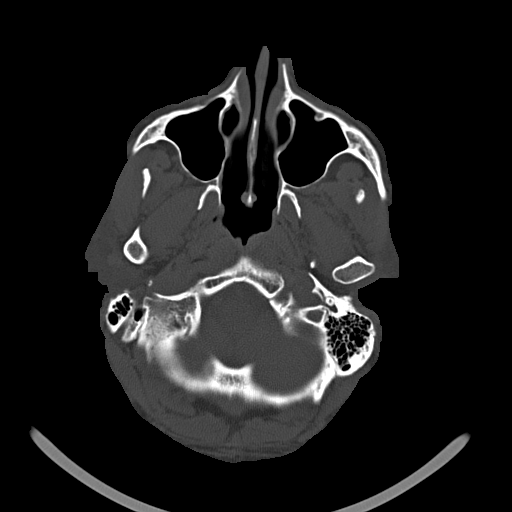
[im 14/66  bone]
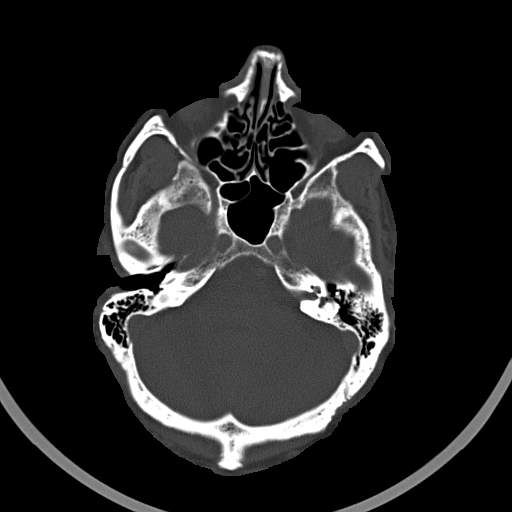
[im 21/66  bone]
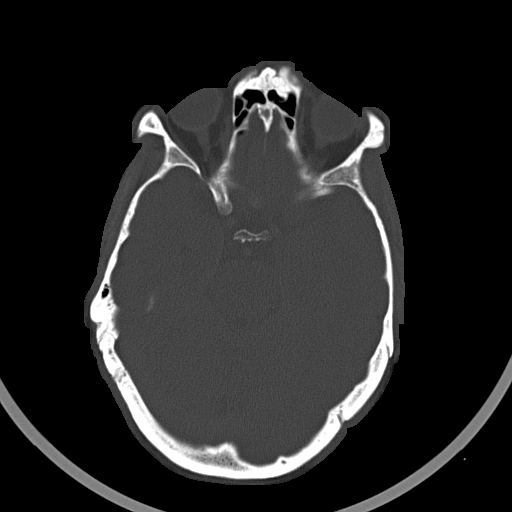
[im 28/66  bone]
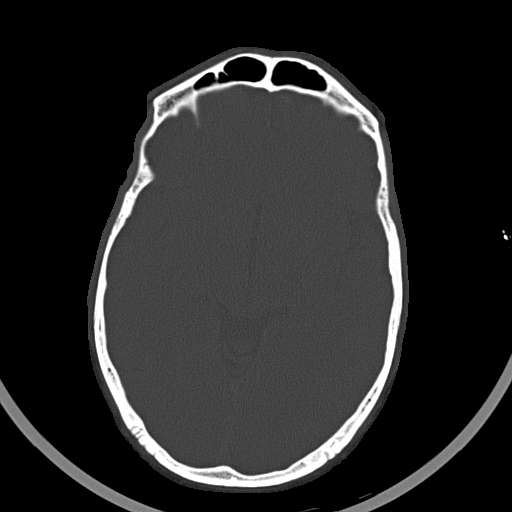
[im 38/66  bone]
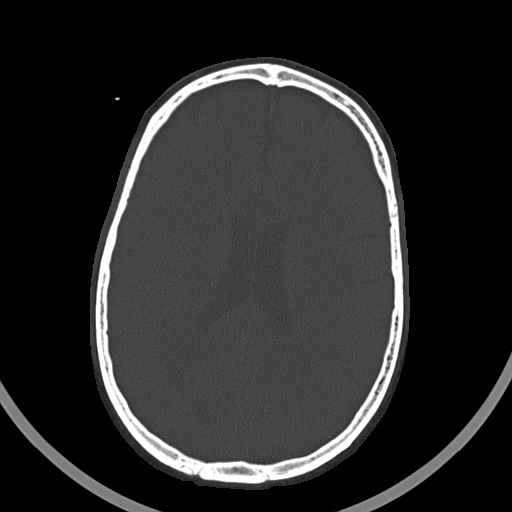
[im 45/66  bone]
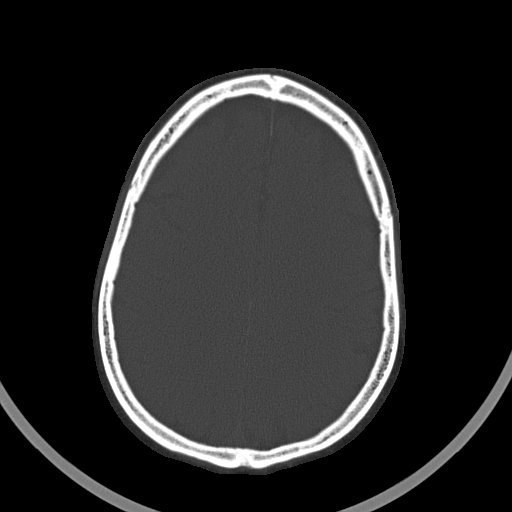
[im 52/66  bone]
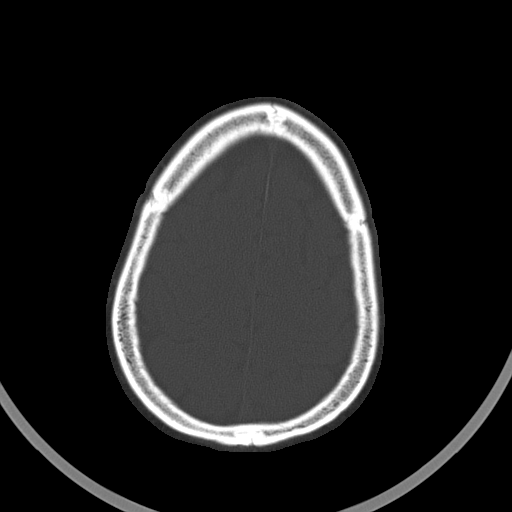
[im 59/66  bone]
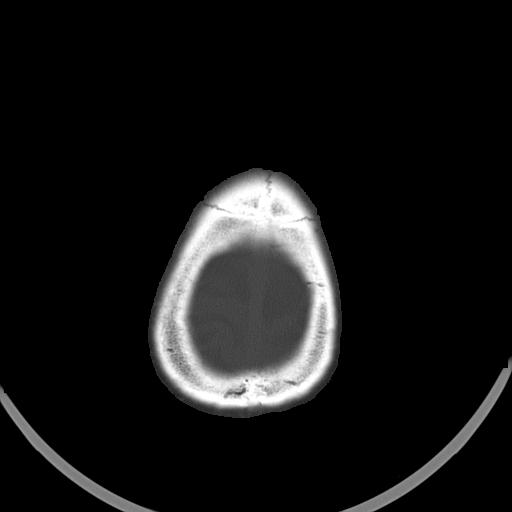

[16 of 30 positions shown; findings below may reference images not displayed]

FINDINGS: Ventricular system normal in size and appearance for age. No mass
lesion. No midline shift. No acute hemorrhage or hematoma. No
extra-axial fluid collections. No evidence of acute infarction. No
focal brain parenchymal abnormalities.

No focal osseous abnormalities involving the skull. Mucous retention
cyst or polyp in the left maxillary sinus. Remaining paranasal
sinuses, bilateral mastoid air cells, and bilateral middle ear
cavities well-aerated.
IMPRESSION: 1. Normal intracranially.
2. Mild chronic left maxillary sinus disease.

## 2017-10-23 ENCOUNTER — Encounter (HOSPITAL_COMMUNITY): Payer: Self-pay | Admitting: Emergency Medicine

## 2017-10-23 ENCOUNTER — Emergency Department (HOSPITAL_COMMUNITY)
Admission: EM | Admit: 2017-10-23 | Discharge: 2017-10-24 | Disposition: A | Discharge status: Home / Self Care | Payer: BLUE CROSS/BLUE SHIELD | Attending: Emergency Medicine | Admitting: Emergency Medicine

## 2017-10-23 ENCOUNTER — Emergency Department (HOSPITAL_COMMUNITY): Payer: BLUE CROSS/BLUE SHIELD

## 2017-10-23 DIAGNOSIS — I10 Essential (primary) hypertension: Secondary | ICD-10-CM | POA: Insufficient documentation

## 2017-10-23 DIAGNOSIS — R079 Chest pain, unspecified: Secondary | ICD-10-CM | POA: Diagnosis present

## 2017-10-23 DIAGNOSIS — R1013 Epigastric pain: Secondary | ICD-10-CM | POA: Diagnosis not present

## 2017-10-23 DIAGNOSIS — Z79899 Other long term (current) drug therapy: Secondary | ICD-10-CM | POA: Insufficient documentation

## 2017-10-23 DIAGNOSIS — F17290 Nicotine dependence, other tobacco product, uncomplicated: Secondary | ICD-10-CM | POA: Diagnosis not present

## 2017-10-23 LAB — COMPREHENSIVE METABOLIC PANEL
ALT: 28 U/L (ref 0–44)
AST: 29 U/L (ref 15–41)
Albumin: 4.1 g/dL (ref 3.5–5.0)
Alkaline Phosphatase: 61 U/L (ref 38–126)
Anion gap: 11 (ref 5–15)
BUN: 17 mg/dL (ref 8–23)
CHLORIDE: 104 mmol/L (ref 98–111)
CO2: 25 mmol/L (ref 22–32)
Calcium: 9.2 mg/dL (ref 8.9–10.3)
Creatinine, Ser: 1.25 mg/dL — ABNORMAL HIGH (ref 0.61–1.24)
GFR, EST NON AFRICAN AMERICAN: 59 mL/min — AB (ref >60)
Glucose, Bld: 101 mg/dL — ABNORMAL HIGH (ref 70–99)
POTASSIUM: 3.3 mmol/L — AB (ref 3.5–5.1)
SODIUM: 140 mmol/L (ref 135–145)
Total Bilirubin: 0.8 mg/dL (ref 0.3–1.2)
Total Protein: 6.6 g/dL (ref 6.5–8.1)

## 2017-10-23 LAB — CBC WITH DIFFERENTIAL/PLATELET
ABS IMMATURE GRANULOCYTES: 0 10*3/uL (ref 0.0–0.1)
Basophils Absolute: 0 10*3/uL (ref 0.0–0.1)
Basophils Relative: 0 %
EOS ABS: 0.4 10*3/uL (ref 0.0–0.7)
Eosinophils Relative: 4 %
HCT: 45.8 % (ref 39.0–52.0)
Hemoglobin: 15.8 g/dL (ref 13.0–17.0)
Immature Granulocytes: 0 %
Lymphocytes Relative: 30 %
Lymphs Abs: 2.8 10*3/uL (ref 0.7–4.0)
MCH: 30.6 pg (ref 26.0–34.0)
MCHC: 34.5 g/dL (ref 30.0–36.0)
MCV: 88.6 fL (ref 78.0–100.0)
MONO ABS: 0.9 10*3/uL (ref 0.1–1.0)
MONOS PCT: 10 %
NEUTROS ABS: 5.4 10*3/uL (ref 1.7–7.7)
NEUTROS PCT: 56 %
Platelets: 199 10*3/uL (ref 150–400)
RBC: 5.17 MIL/uL (ref 4.22–5.81)
RDW: 12 % (ref 11.5–15.5)
WBC: 9.5 10*3/uL (ref 4.0–10.5)

## 2017-10-23 LAB — I-STAT TROPONIN, ED: TROPONIN I, POC: 0 ng/mL (ref 0.00–0.08)

## 2017-10-23 MED ORDER — LORAZEPAM 1 MG PO TABS
1.0000 mg | ORAL_TABLET | Freq: Once | ORAL | Status: AC
  Administered 2017-10-24: 1 mg via ORAL
  Filled 2017-10-23: qty 1

## 2017-10-23 MED ORDER — GI COCKTAIL ~~LOC~~
30.0000 mL | Freq: Once | ORAL | Status: AC
  Administered 2017-10-23: 30 mL via ORAL
  Filled 2017-10-23: qty 30

## 2017-10-23 NOTE — ED Triage Notes (Signed)
BIB [facility] from home with c/o of cp that started yesterday @ 1600. Pt states he wasn't able to go to bed. Reports 2 episodes of diaphoresis and nausea. Received 2 nitro and 324 asprin in route. 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 Pt states he thinks it could be related to the stress.

## 2017-10-23 NOTE — ED Provider Notes (Signed)
[facility] EMERGENCY DEPARTMENT Provider Note   CSN: [id number] Arrival date & time: 10/23/17  2054     History   Chief Complaint Chief Complaint  Patient presents with  . Chest Pain    HPI Cameron Sullivan is a 62 y.o. male who presents with chest discomfort. PMH significant for HTN, GERD. He states that around 4PM he developed substernal chest discomfort/pressure which radiates to the throat. He states he had difficulty sleeping last night and then today he "dealt" with the discomfort. He became diaphoretic several times while sitting which would resolve on its own. This evening he started to feel nauseous and wanted to throw up but didn't. This prompted him to call 911. He received 2 nitro and ASA because his blood pressure was in the 200s. This resolved his chest discomfort but he still has epigastric pain and belching which has been and ongoing problem for him and he refuses to take medicine for this because he doesn't like pills. He reports his symptoms are very similar as to when he was admitted in 2015 for chest pain r/o. He was recommended to have an outpatient ST and echo at that time but didn't. His PCP told him that he had an abnormality on his EKG which wasn't there before but the patient refused f/u with cardiology because he states that he exercises a lot and never had chest pain or SOB with this. 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 He denies fever, chills, SOB, cough, vomiting, leg swelling.  HPI  Past Medical History:  Diagnosis Date  . Chest pain   . Hyperlipidemia   . Hypertension   . Noncompliance     Patient Active Problem List   Diagnosis Date Noted  . Unstable angina (HCC) 10/17/2013  . Hyperlipidemia   . Angina at rest [facility](HCC) 10/16/2013  . Hypertensive urgency 10/16/2013  . Insomnia 10/16/2013  . HTN (hypertension) 10/16/2013  . History of noncompliance with medical treatment 10/16/2013    History  reviewed. No pertinent surgical history.      Home Medications    Prior to Admission medications   Medication Sig Start Date End Date Taking? Authorizing Provider  lisinopril-hydrochlorothiazide (PRINZIDE,ZESTORETIC) 20-12.5 MG tablet Take 1 tablet by mouth daily. 10/04/17  Yes [provider]  nitroGLYCERIN (NITROSTAT) 0.4 MG SL tablet Take 1 tab under tongue every 5 min as needed for chest pain. Call 911 if symptoms remain 5 minutes after first administration. 10/17/13  Yes Tyrone NineGrunz, Ryan B, MD  amLODipine (NORVASC) 10 MG tablet Take 0.5 tablets (5 mg total) by mouth daily. Patient not taking: Reported on 10/23/2017 10/17/13   Tyrone NineGrunz, Ryan B, MD  aspirin 81 MG tablet Take 1 tablet (81 mg total) by mouth daily. Patient not taking: Reported on 10/23/2017 10/17/13   Tyrone NineGrunz, Ryan B, MD  atorvastatin (LIPITOR) 40 MG tablet Take 1 tablet (40 mg total) by mouth daily. Patient not taking: Reported on 10/23/2017 10/17/13   Charlane FerrettiMarsh, Melanie C, MD    Family History Family History  Problem Relation Age of Onset  . Hypertension Father   . Cancer Father        died @ 2282  . Hypertension Mother        alive in her 4780s  . Alzheimer's disease Mother   . Hyperlipidemia Brother     Social History Social History   Tobacco Use  . Smoking status: Current Some Day Smoker  .  Tobacco comment: smokes 1 cigar/wk x many years.  Substance Use Topics  . Alcohol use: Yes    Alcohol/week: 4.2 oz    Types: 7 Glasses of wine per week  . Drug use: No     Allergies   Patient has no known allergies.   Review of Systems Review of Systems  Constitutional: Negative for fever.  Respiratory: Negative for cough and shortness of breath.   Cardiovascular: Positive for chest pain. Negative for leg swelling.  Gastrointestinal: Positive for abdominal pain, nausea and vomiting. Negative for diarrhea.  Musculoskeletal: Positive for arthralgias (L shoulder and neck).  Neurological: Negative for syncope.  All other systems  reviewed and are negative.    Physical Exam Updated Vital Signs BP (!) 161/93   Pulse 69   Temp 98 F (36.7 C)   Resp 14   Ht 5\' 7"  (1.702 m)   Wt 81.6 kg (180 lb)   SpO2 97%   BMI 28.19 kg/m   Physical Exam  Constitutional: He is oriented to person, place, and time. He appears well-developed and well-nourished. No distress.  Calm and cooperative  HENT:  Head: Normocephalic and atraumatic.  Eyes: Pupils are equal, round, and reactive to light. Conjunctivae are normal. Right eye exhibits no discharge. Left eye exhibits no discharge. No scleral icterus.  Neck: Normal range of motion.  Cardiovascular: Normal rate and regular rhythm.  Pulmonary/Chest: Effort normal and breath sounds normal. No respiratory distress.  Abdominal: Soft. Bowel sounds are normal. He exhibits no distension. There is tenderness (epigastric).  Musculoskeletal:  No peripheral edema or calf tenderness  Neurological: He is alert and oriented to person, place, and time.  Skin: Skin is warm and dry.  Psychiatric: He has a normal mood and affect. His behavior is normal.  Nursing note and vitals reviewed.    ED Treatments / Results  Labs (all labs ordered are listed, but only abnormal results are displayed) Labs Reviewed  COMPREHENSIVE METABOLIC PANEL - Abnormal; Notable for the following components:      Result Value   Potassium 3.3 (*)    Glucose, Bld 101 (*)    Creatinine, Ser 1.25 (*)    GFR calc non Af Amer 59 (*)    All other components within normal limits  CBC WITH DIFFERENTIAL/PLATELET  I-STAT TROPONIN, ED  I-STAT TROPONIN, ED    EKG EKG Interpretation  Date/Time:  Wednesday October 23 2017 20:58:58 EDT Ventricular Rate:  70 PR Interval:    QRS Duration: 131 QT Interval:  437 QTC Calculation: 472 R Axis:   27 Text Interpretation:  Sinus arrhythmia Ventricular premature complex Right bundle branch block RBBB new since 2015 Confirmed by Richardean Canal [phone]) on 10/23/2017 9:01:29  PM   Radiology Dg Chest 2 View  Result Date: 10/23/2017 CLINICAL DATA:  Acute onset of generalized chest pain and nausea. EXAM: CHEST - 2 VIEW COMPARISON:  Chest radiograph performed 10/16/2013 FINDINGS: The lungs are well-aerated and clear. There is no evidence of focal opacification, pleural effusion or pneumothorax. The heart is normal in size; the mediastinal contour is within normal limits. No acute osseous abnormalities are seen. Degenerative change is noted at the glenohumeral joints bilaterally. IMPRESSION: No acute cardiopulmonary process seen. Electronically Signed   By: Roanna Raider M.D.   On: 10/23/2017 22:03    Procedures Procedures (including critical care time)  Medications Ordered in ED Medications  gi cocktail (Maalox,Lidocaine,Donnatal) (30 mLs Oral Given 10/23/17 2259)  LORazepam (ATIVAN) tablet 1 mg (1 mg Oral  Given 10/24/17 0010)     Initial Impression / Assessment and Plan / ED Course  I have reviewed the triage vital signs and the nursing notes.  Pertinent labs & imaging results that were available during my care of the patient were reviewed by me and considered in my medical decision making (see chart for details).  62 year old male presents with chest discomfort and GI upset for the past day. He is hypertensive but otherwise vitals are normal. Exam is unremarkable other than occasional belching. EKG is RBBB which is not new per patient. CXR is normal. CBC is normal. CMP is remarkable for mild AKI. Initial troponin is 0. Symptoms seem related to GERD/gastritis which may be causing him to be more hypertensive since he is so uncomfortable. He is fixated on his GI issues causing poor blood flow to the heart and is somewhat anxious on exam. His HEART score is 4. He has typical and atypical features to his history. Admission for a chest pain r/o was recommended but the patient states he doesn't want this because last time he felt like nothing was done for him and it just made  him more aggravated being in the hospital. He wants to be discharged with a new rx for Nitro which he has run out of. 2nd trop was performed which was negative. He was discharged and strongly advised to f/u with cardiology.   Final Clinical Impressions(s) / ED Diagnoses   Final diagnoses:  Nonspecific chest pain  Epigastric pain    ED Discharge Orders    None       Bethel Born, PA-C 10/24/17 1514    Charlynne Pander, MD 10/25/17 [phone]

## 2017-10-24 LAB — I-STAT TROPONIN, ED: TROPONIN I, POC: 0.01 ng/mL (ref 0.00–0.08)

## 2018-01-09 ENCOUNTER — Ambulatory Visit: Payer: BLUE CROSS/BLUE SHIELD | Admitting: Interventional Cardiology

## 2018-12-30 ENCOUNTER — Emergency Department (HOSPITAL_COMMUNITY)
Admission: EM | Admit: 2018-12-30 | Discharge: 2018-12-31 | Disposition: A | Discharge status: Home / Self Care | Payer: Medicare Other | Attending: Emergency Medicine | Admitting: Emergency Medicine

## 2018-12-30 ENCOUNTER — Emergency Department (HOSPITAL_COMMUNITY): Payer: Medicare Other

## 2018-12-30 ENCOUNTER — Other Ambulatory Visit: Payer: Self-pay

## 2018-12-30 DIAGNOSIS — F17290 Nicotine dependence, other tobacco product, uncomplicated: Secondary | ICD-10-CM | POA: Diagnosis not present

## 2018-12-30 DIAGNOSIS — I10 Essential (primary) hypertension: Secondary | ICD-10-CM | POA: Insufficient documentation

## 2018-12-30 DIAGNOSIS — Z79899 Other long term (current) drug therapy: Secondary | ICD-10-CM | POA: Insufficient documentation

## 2018-12-30 DIAGNOSIS — Z7982 Long term (current) use of aspirin: Secondary | ICD-10-CM | POA: Insufficient documentation

## 2018-12-30 DIAGNOSIS — R0602 Shortness of breath: Secondary | ICD-10-CM | POA: Diagnosis not present

## 2018-12-30 DIAGNOSIS — R079 Chest pain, unspecified: Secondary | ICD-10-CM | POA: Diagnosis present

## 2018-12-30 DIAGNOSIS — R0789 Other chest pain: Secondary | ICD-10-CM | POA: Diagnosis not present

## 2018-12-30 LAB — CBC
HCT: 46.2 % (ref 39.0–52.0)
Hemoglobin: 16.5 g/dL (ref 13.0–17.0)
MCH: 30.4 pg (ref 26.0–34.0)
MCHC: 35.7 g/dL (ref 30.0–36.0)
MCV: 85.2 fL (ref 80.0–100.0)
Platelets: 235 10*3/uL (ref 150–400)
RBC: 5.42 MIL/uL (ref 4.22–5.81)
RDW: 11.9 % (ref 11.5–15.5)
WBC: 9 10*3/uL (ref 4.0–10.5)
nRBC: 0 % (ref 0.0–0.2)

## 2018-12-30 LAB — BASIC METABOLIC PANEL
Anion gap: 14 (ref 5–15)
BUN: 10 mg/dL (ref 8–23)
CO2: 20 mmol/L — ABNORMAL LOW (ref 22–32)
Calcium: 9.6 mg/dL (ref 8.9–10.3)
Chloride: 103 mmol/L (ref 98–111)
Creatinine, Ser: 1.18 mg/dL (ref 0.61–1.24)
GFR calc Af Amer: >60 mL/min (ref >60)
GFR calc non Af Amer: >60 mL/min (ref >60)
Glucose, Bld: 91 mg/dL (ref 70–99)
Potassium: 3.2 mmol/L — ABNORMAL LOW (ref 3.5–5.1)
Sodium: 137 mmol/L (ref 135–145)

## 2018-12-30 LAB — TROPONIN I (HIGH SENSITIVITY)
Troponin I (High Sensitivity): 7 ng/L (ref <18)
Troponin I (High Sensitivity): 10 ng/L (ref <18)

## 2018-12-30 LAB — D-DIMER, QUANTITATIVE: D-Dimer, Quant: <0.27 ug/mL-FEU (ref 0.00–0.50)

## 2018-12-30 MED ORDER — LIDOCAINE VISCOUS HCL 2 % MT SOLN
15.0000 mL | Freq: Once | OROMUCOSAL | Status: AC
  Administered 2018-12-30: 15 mL via ORAL
  Filled 2018-12-30: qty 15

## 2018-12-30 MED ORDER — ALUM & MAG HYDROXIDE-SIMETH 200-200-20 MG/5ML PO SUSP
30.0000 mL | Freq: Once | ORAL | Status: AC
  Administered 2018-12-30: 21:00:00 30 mL via ORAL
  Filled 2018-12-30: qty 30

## 2018-12-30 MED ORDER — PANTOPRAZOLE SODIUM 40 MG PO TBEC: 40.0000 mg | DELAYED_RELEASE_TABLET | Freq: Every day | ORAL | 0 refills | Status: DC

## 2018-12-30 MED ORDER — POTASSIUM CHLORIDE 10 MEQ/100ML IV SOLN
10.0000 meq | Freq: Once | INTRAVENOUS | Status: AC
  Administered 2018-12-30: 10 meq via INTRAVENOUS
  Filled 2018-12-30: qty 100

## 2018-12-30 NOTE — ED Triage Notes (Signed)
Pt arrives from home by EMS with complaints of chest pain X5 days and now shortness of breath. Pt took 3X nitro last night with minimal relief. Pt called GP and was instructed to goto the ED. Pt rates pain 10/10 last night and 1/10 now.

## 2018-12-30 NOTE — Discharge Instructions (Signed)
Please follow up with both your PCP and cardiology I recommend calling your PCP tomorrow for further evaluation as you will likely be able to see him sooner than cardiology  Return to the ED IMMEDIATELY for any worsening symptoms

## 2018-12-30 NOTE — ED Provider Notes (Signed)
[facility] EMERGENCY DEPARTMENT Provider Note   CSN: [id number] Arrival date & time: 12/30/18  [place]     History   Chief Complaint Chief Complaint  Patient presents with  . Chest Pain  . Shortness of Breath    HPI Cameron Sullivan is a 63 y.o. male with PMHx HTN and HLD who presents to the ED today complaining of gradual onset, intermittent, achy, left sided chest pain x 5 days.  He reports this chest pain began about 1 hour after eating a salad with a boiled egg on Friday.  States it has been on and off since then.  He did state that yesterday he felt a little bit more improved and decided to workout including going down a stair stepper.  Patient states he did not have any chest pain while working out and he "felt great."  He states that a couple of hours later he started having worsening chest pain.  Patient had nitroglycerin at home and took it times over the state of 45 minutes with minimal relief.  He states he woke up this morning and felt slightly better but called his GP as he has a physical scheduled in 2 weeks and wanted to see if he could move it up earlier.  GP told patient to go immediately to the ED.  States afterwards he began to feel "panicky" when EMS arrived his blood pressure was higher than normal.  He states his chest pain began worse.  Did take aspirin prior to EMS arriving and states that his pain went from a 10 on a 10 to a 1 out of 10.  Patient states he currently has 1 out of 10 chest pain.  He is also complaining of some shortness of breath that is present with the chest pain.  Denies cough, nausea, vomiting, leg swelling, palpitations, any other associated symptoms.  No history DVT/PE.  No recent prolonged travel or immobilization.  No hemoptysis.  No active malignancy. Pt is a current some day smoker.        Past Medical History:  Diagnosis Date  . Chest pain   . Hyperlipidemia   . Hypertension   . Noncompliance     Patient Active Problem  List   Diagnosis Date Noted  . Unstable angina ([place]) 10/17/2013  . Hyperlipidemia   . Angina at rest [facility]) 10/16/2013  . Hypertensive urgency 10/16/2013  . Insomnia 10/16/2013  . HTN (hypertension) 10/16/2013  . History of noncompliance with medical treatment 10/16/2013    No past surgical history on file.      Home Medications    Prior to Admission medications   Medication Sig Start Date End Date Taking? Authorizing Provider  amLODipine (NORVASC) 10 MG tablet Take 0.5 tablets (5 mg total) by mouth daily. Patient not taking: Reported on 10/23/2017 10/17/13   Patrecia Pour, MD  aspirin 81 MG tablet Take 1 tablet (81 mg total) by mouth daily. Patient not taking: Reported on 10/23/2017 10/17/13   Patrecia Pour, MD  atorvastatin (LIPITOR) 40 MG tablet Take 1 tablet (40 mg total) by mouth daily. Patient not taking: Reported on 10/23/2017 10/17/13   Bernadene Bell, MD  lisinopril-hydrochlorothiazide (PRINZIDE,ZESTORETIC) 20-12.5 MG tablet Take 1 tablet by mouth daily. 10/04/17   [provider]  nitroGLYCERIN (NITROSTAT) 0.4 MG SL tablet Take 1 tab under tongue every 5 min as needed for chest pain. Call 911 if symptoms remain 5 minutes after first administration. 10/17/13   Patrecia Pour,  MD  pantoprazole (PROTONIX) 40 MG tablet Take 1 tablet (40 mg total) by mouth daily. 12/30/18 01/29/19  Tanda Rockers, PA-C    Family History Family History  Problem Relation Age of Onset  . Hypertension Father   . Cancer Father        died @ 10  . Hypertension Mother        alive in her 53s  . Alzheimer's disease Mother   . Hyperlipidemia Brother     Social History Social History   Tobacco Use  . Smoking status: Current Some Day Smoker  . Tobacco comment: smokes 1 cigar/wk x many years.  Substance Use Topics  . Alcohol use: Yes    Alcohol/week: 7.0 standard drinks    Types: 7 Glasses of wine per week  . Drug use: No     Allergies   Patient has no known allergies.   Review of  Systems Review of Systems  Constitutional: Negative for chills and fever.  HENT: Negative for congestion.   Eyes: Negative for visual disturbance.  Respiratory: Positive for shortness of breath. Negative for cough.   Cardiovascular: Positive for chest pain.  Gastrointestinal: Negative for abdominal pain, constipation, diarrhea, nausea and vomiting.  Genitourinary: Negative for difficulty urinating.  Musculoskeletal: Negative for myalgias.  Skin: Negative for rash.  Neurological: Negative for headaches.     Physical Exam Updated Vital Signs BP (!) 161/78   Pulse 72   Temp 97.8 F (36.6 C) (Oral)   Resp 16   Ht  (1.702 m)   Wt 78.9 kg   SpO2 99%   BMI 27.25 kg/m   Physical Exam Vitals signs and nursing note reviewed.  Constitutional:      Appearance: He is not ill-appearing or diaphoretic.  HENT:     Head: Normocephalic and atraumatic.  Eyes:     Conjunctiva/sclera: Conjunctivae normal.  Neck:     Musculoskeletal: Neck supple.  Cardiovascular:     Rate and Rhythm: Normal rate and regular rhythm.     Pulses:          Radial pulses are 2+ on the right side and 2+ on the left side.       Dorsalis pedis pulses are 2+ on the right side and 2+ on the left side.     Heart sounds: Normal heart sounds.  Pulmonary:     Effort: Pulmonary effort is normal.     Breath sounds: Normal breath sounds. No decreased breath sounds, wheezing, rhonchi or rales.  Chest:     Chest wall: No tenderness.  Abdominal:     Palpations: Abdomen is soft.     Tenderness: There is no abdominal tenderness. There is no guarding or rebound.  Musculoskeletal:     Right lower leg: No edema.     Left lower leg: No edema.  Skin:    General: Skin is warm and dry.  Neurological:     Mental Status: He is alert.      ED Treatments / Results  Labs (all labs ordered are listed, but only abnormal results are displayed) Labs Reviewed  BASIC METABOLIC PANEL - Abnormal; Notable for the following  components:      Result Value   Potassium 3.2 (*)    CO2 20 (*)    All other components within normal limits  CBC  D-DIMER, QUANTITATIVE (NOT AT [facility])  CBG MONITORING, ED  TROPONIN I (HIGH SENSITIVITY)  TROPONIN I (HIGH SENSITIVITY)    EKG EKG Interpretation  Date/Time:  Tuesday December 30 2018 18:21:06 EDT Ventricular Rate:  74 PR Interval:    QRS Duration: 135 QT Interval:  433 QTC Calculation: 481 R Axis:   65 Text Interpretation:  Sinus rhythm Right bundle branch block Confirmed by Tilden Fossa [phone]) on 12/30/2018 6:23:36 PM   Radiology Dg Chest Portable 1 View  Result Date: 12/30/2018 CLINICAL DATA:  63 year old male chest pain and shortness of breath. No relief with nitroglycerin. EXAM: PORTABLE CHEST 1 VIEW COMPARISON:  Chest radiographs 10/23/2017. FINDINGS: Portable AP semi upright view at 1920 hours. Lung volumes and mediastinal contours remain normal. Visualized tracheal air column is within normal limits. Allowing for portable technique the lungs are clear. Chronic degenerative changes at both shoulders. No acute osseous abnormality identified. Negative visible bowel gas pattern. IMPRESSION: Negative portable chest. Electronically Signed   By: Odessa Fleming M.D.   On: 12/30/2018 19:52    Procedures Procedures (including critical care time)  Medications Ordered in ED Medications  alum & mag hydroxide-simeth (MAALOX/MYLANTA) 200-200-20 MG/5ML suspension 30 mL (30 mLs Oral Given 12/30/18 2057)    And  lidocaine (XYLOCAINE) 2 % viscous mouth solution 15 mL (15 mLs Oral Given 12/30/18 2057)  potassium chloride 10 mEq in 100 mL IVPB (0 mEq Intravenous Stopped 12/30/18 2202)     Initial Impression / Assessment and Plan / ED Course  I have reviewed the triage vital signs and the nursing notes.  Pertinent labs & imaging results that were available during my care of the patient were reviewed by me and considered in my medical decision making (see chart for details).     63 year old male who presents to the ED with intermittent chest pain for the past 5 days with shortness of breath.  Chest pain so severe last night had to take 3 times nitroglycerin with minimal relief.  Pain today mildly severe.  Took aspirin with relief.  1 out of 10 on arrival.  Appears comfortable on my exam.  He continues to belch.  He states that this is been an ongoing issue for the past few days as well.  It does appear the patient was seen in the ED for similar complaints last year -commended inpatient for cardiac work-up but patient declined and went home with prescription for nitroglycerin.  He states that he was advised to follow-up with cardiology but he was waiting till his insurance was better as he has a high deductible.  Has not followed up with cardiology.  Will work-up for ACS today.  Patient does not have any risk factors for PE.  Non-tachycardic on exam.  He is satting above 95% on room air and able to speak in full sentences.  Very much doubt PE today.   EKG shows right bundle branch block but essentially unchanged from EKG last year.  Chest x-ray negative.  Initial troponin of 7.  Potassium is mildly decreased at 3.2.  Patient is on a dual antihypertensive medicine including HCTZ which is likely decreasing his potassium over time.  Will replete with 10 mEq in the ED today.   Upon reevaluation patient is sitting in a chair instead of the bed.  He states he began to feel so short of breath that he needed to get up and sit somewhere else.  He does appear to be working slightly harder to breathe but sats remain 99% on room air.  His lungs are clear to auscultation.  Awaiting repeat troponin at this time.  Although patient does not have any risk  factors for PE given his complaint of shortness of breath and his appearance of working so hard to breathe will add on a d-dimer at this time.  She continues to belch during my exam as well.  Will attempt GI cocktail     Upon reevaluation pt  appears more comfortable after GI cocktail and is no longer belching. Repeat troponin of 10. Heart score of 5; recommended either observation or close outpatient follow up. Pt does not currently have a cardiologist. I had a lengthy discussion with him regarding the risks and benefits; he understands and would like to follow up outpatient. He will follow up with his PCP tomorrow morning and I have referred him to cardiology as well. Strict return precautions have been discussed. Pt is in agreement with plan at this time and stable for discharge home.   This note was prepared using Dragon voice recognition software and may include unintentional dictation errors due to the inherent limitations of voice recognition software.     Final Clinical Impressions(s) / ED Diagnoses   Final diagnoses:  Atypical chest pain  Essential hypertension    ED Discharge Orders         Ordered    pantoprazole (PROTONIX) 40 MG tablet  Daily     12/30/18 2322           Tanda RockersVenter, [place], PA-C 12/30/18 2323    Tilden Fossaees, Elizabeth, MD 12/31/18 1231

## 2019-01-30 ENCOUNTER — Ambulatory Visit: Payer: Medicare Other | Admitting: Cardiology

## 2019-01-30 ENCOUNTER — Encounter: Payer: Self-pay | Admitting: Cardiology

## 2019-01-30 ENCOUNTER — Other Ambulatory Visit: Payer: Self-pay

## 2019-01-30 VITALS — BP 160/80 | HR 104 | Ht 67.0 in | Wt 176.8 lb

## 2019-01-30 DIAGNOSIS — R079 Chest pain, unspecified: Secondary | ICD-10-CM | POA: Diagnosis not present

## 2019-01-30 DIAGNOSIS — Z01812 Encounter for preprocedural laboratory examination: Secondary | ICD-10-CM

## 2019-01-30 DIAGNOSIS — R0602 Shortness of breath: Secondary | ICD-10-CM | POA: Diagnosis not present

## 2019-01-30 DIAGNOSIS — I4510 Unspecified right bundle-branch block: Secondary | ICD-10-CM

## 2019-01-30 DIAGNOSIS — I10 Essential (primary) hypertension: Secondary | ICD-10-CM | POA: Diagnosis not present

## 2019-01-30 MED ORDER — METOPROLOL TARTRATE 100 MG PO TABS: 100.0000 mg | ORAL_TABLET | Freq: Once | ORAL | 0 refills | Status: DC

## 2019-01-30 NOTE — Progress Notes (Signed)
Cardiology Office Note:    Date:  01/30/2019   ID:  Cameron Sullivan, DOB May 08, 1955, MRN [id number]  PCP:  System, Pcp Not In  Cardiologist:  Donato SchultzMark Roslyn Else, MD  Electrophysiologist:  None   Referring MD: No ref. provider found     History of Present Illness:    Cameron BoomDaniel Schneeberger is a 63 y.o. male here for the evaluation of chest pain at the request of Dr. Madilyn Hookees.  In the emergency room on 01/01/2019 with chest pain and shortness of breath.  Felt left-sided chest pain for about 5 days intermittent achiness but seem to begin after eating a salad with boiled egg.  Have been off and on.  He felt great after working out.  Then a couple hours he started having worsening pain.  He did have some nitroglycerin then took it with minimal relief.  Felt some panic.  Blood pressure was higher than normal.  Heart score was 5.  Mother and Father with HTN. Father had cancer of esophagus.   Has GERD. Sit up at night at times for ten years. BP meds at 54.   Mother has Alzheimers.  In August had chest pressure. O2 level dropped to 60%. Was taking BP meds under the tongue.  Worried that he may have damaged his heart.  Still sometimes he can exercise and feels great afterwards.  Past Medical History:  Diagnosis Date  . Chest pain   . Hyperlipidemia   . Hypertension   . Noncompliance     No past surgical history on file.  Current Medications: Current Meds  Medication Sig  . lisinopril-hydrochlorothiazide (PRINZIDE,ZESTORETIC) 20-12.5 MG tablet Take 1 tablet by mouth daily.  . nitroGLYCERIN (NITROSTAT) 0.4 MG SL tablet Take 1 tab under tongue every 5 min as needed for chest pain. Call 911 if symptoms remain 5 minutes after first administration.     Allergies:   Patient has no known allergies.   Social History   Socioeconomic History  . Marital status: Married    Spouse name: Not on file  . Number of children: Not on file  . Years of education: Not on file  . Highest education level: Not on file   Occupational History  . Not on file  Social Needs  . Financial resource strain: Not on file  . Food insecurity    Worry: Not on file    Inability: Not on file  . Transportation needs    Medical: Not on file    Non-medical: Not on file  Tobacco Use  . Smoking status: Current Some Day Smoker  . Smokeless tobacco: Never Used  . Tobacco comment: smokes 1 cigar/wk x many years.  Substance and Sexual Activity  . Alcohol use: Yes    Alcohol/week: 7.0 standard drinks    Types: 7 Glasses of wine per week  . Drug use: No  . Sexual activity: Not on file  Lifestyle  . Physical activity    Days per week: Not on file    Minutes per session: Not on file  . Stress: Not on file  Relationships  . Social Musicianconnections    Talks on phone: Not on file    Gets together: Not on file    Attends religious service: Not on file    Active member of club or organization: Not on file    Attends meetings of clubs or organizations: Not on file    Relationship status: Not on file  Other Topics Concern  . Not on file  Social History Narrative   Lives in [place] with wife.  Exercises regularly.     Family History: The patient's family history includes Alzheimer's disease in his mother; Cancer in his father; Hyperlipidemia in his brother; Hypertension in his father and mother.  ROS:   Please see the history of present illness.    Denies any fevers chills nausea vomiting syncope bleeding all other systems reviewed and are negative.  EKGs/Labs/Other Studies Reviewed:    The following studies were reviewed today: Chest x-ray normal  EKG: 12/30/2018-right bundle branch block with no other abnormalities.  Recent Labs: 12/30/2018: BUN 10; Creatinine, Ser 1.18; Hemoglobin 16.5; Platelets 235; Potassium 3.2; Sodium 137  Recent Lipid Panel    Component Value Date/Time   CHOL 213 (H) 10/17/2013 0409   TRIG 184 (H) 10/17/2013 0409   HDL 54 10/17/2013 0409   CHOLHDL 3.9 10/17/2013 0409   VLDL 37  10/17/2013 0409   LDLCALC 122 (H) 10/17/2013 0409    Physical Exam:    VS:  BP (!) 160/80   Pulse (!) 104   Ht 5\' 7"  (1.702 m)   Wt 176 lb 12.8 oz (80.2 kg)   SpO2 95%   BMI 27.69 kg/m     Wt Readings from Last 3 Encounters:  01/30/19 176 lb 12.8 oz (80.2 kg)  12/30/18 174 lb (78.9 kg)  10/23/17 180 lb (81.6 kg)     GEN:  Well nourished, well developed in no acute distress HEENT: Normal NECK: No JVD; No carotid bruits LYMPHATICS: No lymphadenopathy CARDIAC: RRR, no murmurs, rubs, gallops RESPIRATORY:  Clear to auscultation without rales, wheezing or rhonchi  ABDOMEN: Soft, non-tender, non-distended MUSCULOSKELETAL:  No edema; No deformity  SKIN: Warm and dry NEUROLOGIC:  Alert and oriented x 3 PSYCHIATRIC:  Normal affect   ASSESSMENT:    1. Chest pain of uncertain etiology   2. Essential hypertension   3. Shortness of breath   4. Pre-procedure lab exam   5. RBBB    PLAN:    In order of problems listed above:  Atypical chest pain -Right bundle branch block on ECG.  Recent ER visit. We will go ahead and perform a CT scan with possible FFR analysis for further evaluation.  At times that he does have some worrisome chest pressure-like symptoms.  He is thought this is been in his GERD for quite some time. -We will check a an echocardiogram to ensure proper structure and function especially given his right bundle branch block.  Right bundle branch block -Understands that this abnormality may not result in any further deleterious effects.  No pacemaker needs.  Medication Adjustments/Labs and Tests Ordered: Current medicines are reviewed at length with the patient today.  Concerns regarding medicines are outlined above.  Orders Placed This Encounter  Procedures  . CT CORONARY MORPH W/CTA COR W/SCORE W/CA W/CM &/OR WO/CM  . CT CORONARY FRACTIONAL FLOW RESERVE DATA PREP  . CT CORONARY FRACTIONAL FLOW RESERVE FLUID ANALYSIS  . Basic Metabolic Panel (BMET)  .  ECHOCARDIOGRAM COMPLETE   Meds ordered this encounter  Medications  . metoprolol tartrate (LOPRESSOR) 100 MG tablet    Sig: Take 1 tablet (100 mg total) by mouth once for 1 dose. Take one tablet 2 hours before your CT scan    Dispense:  1 tablet    Refill:  0    Patient Instructions  Medication Instructions:  The current medical regimen is effective;  continue present plan and medications. *If you need a refill  on your cardiac medications before your next appointment, please call your pharmacy*  Lab Work: Please have blood work before your CT scan. (BMP) If you have labs (blood work) drawn today and your tests are completely normal, you will receive your results only by: Marland Kitchen MyChart Message (if you have MyChart) OR . A paper copy in the mail If you have any lab test that is abnormal or we need to change your treatment, we will call you to review the results.  Testing/Procedures: Your physician has requested that you have an echocardiogram. Echocardiography is a painless test that uses sound waves to create images of your heart. It provides your doctor with information about the size and shape of your heart and how well your heart's chambers and valves are working. This procedure takes approximately one hour. There are no restrictions for this procedure.  Your physician has requested that you have cardiac CT. Cardiac computed tomography (CT) is a painless test that uses an x-ray machine to take clear, detailed pictures of your heart. For further information please visit [email or web address]. Please follow instruction sheet as given.  Follow-Up: Further follow up will be based on the results of the above testing.  Thank you for choosing [facility]!!    Your cardiac CT will be scheduled at one of the below locations:   [facility] [address] [place], [place] [address] [phone]  Please arrive at the [facility] main entrance of [facility]  30-45 minutes prior to test start time. Proceed to the [facility] Radiology Department (first floor) to check-in and test prep.  Please follow these instructions carefully (unless otherwise directed):  Hold all erectile dysfunction medications at least 3 days (72 hrs) prior to test.  On the Night Before the Test: . Be sure to Drink plenty of water. . Do not consume any caffeinated/decaffeinated beverages or chocolate 12 hours prior to your test. . Do not take any antihistamines 12 hours prior to your test.  On the Day of the Test: . Drink plenty of water. Do not drink any water within one hour of the test. . Do not eat any food 4 hours prior to the test. . You may take your regular medications prior to the test.  . Take metoprolol (Lopressor) two hours prior to test. . HOLD Furosemide/Hydrochlorothiazide morning of the test. . FEMALES- please wear underwire-free bra if available  After the Test: . Drink plenty of water. . After receiving IV contrast, you may experience a mild flushed feeling. This is normal. . On occasion, you may experience a mild rash up to 24 hours after the test. This is not dangerous. If this occurs, you can take Benadryl 25 mg and increase your fluid intake. . If you experience trouble breathing, this can be serious. If it is severe call 911 IMMEDIATELY. If it is mild, please call our office. . If you take any of these medications: Glipizide/Metformin, Avandament, Glucavance, please do not take 48 hours after completing test unless otherwise instructed.   Once we have confirmed authorization from your insurance company, we will call you to set up a date and time for your test.   For non-scheduling related questions, please contact the cardiac imaging nurse navigator should you have any questions/concerns: Marchia Bond, RN Navigator Cardiac Imaging [facility] [phone] Office        Signed, Candee Furbish, MD  01/30/2019 3:51  PM    [place]  [facility]

## 2019-01-30 NOTE — Patient Instructions (Addendum)
Medication Instructions:  The current medical regimen is effective;  continue present plan and medications. *If you need a refill on your cardiac medications before your next appointment, please call your pharmacy*  Lab Work: Please have blood work before your CT scan. (BMP) If you have labs (blood work) drawn today and your tests are completely normal, you will receive your results only by: Marland Kitchen MyChart Message (if you have MyChart) OR . A paper copy in the mail If you have any lab test that is abnormal or we need to change your treatment, we will call you to review the results.  Testing/Procedures: Your physician has requested that you have an echocardiogram. Echocardiography is a painless test that uses sound waves to create images of your heart. It provides your doctor with information about the size and shape of your heart and how well your heart's chambers and valves are working. This procedure takes approximately one hour. There are no restrictions for this procedure.  Your physician has requested that you have cardiac CT. Cardiac computed tomography (CT) is a painless test that uses an x-ray machine to take clear, detailed pictures of your heart. For further information please visit [email or web address]. Please follow instruction sheet as given.  Follow-Up: Further follow up will be based on the results of the above testing.  Thank you for choosing [facility]!!    Your cardiac CT will be scheduled at one of the below locations:   [facility] [address] [place], [place] [address] [phone]  Please arrive at the [facility] main entrance of [facility] 30-45 minutes prior to test start time. Proceed to the [facility] Radiology Department (first floor) to check-in and test prep.  Please follow these instructions carefully (unless otherwise directed):  Hold all erectile dysfunction medications at least 3 days (72 hrs) prior to test.  On the  Night Before the Test: . Be sure to Drink plenty of water. . Do not consume any caffeinated/decaffeinated beverages or chocolate 12 hours prior to your test. . Do not take any antihistamines 12 hours prior to your test.  On the Day of the Test: . Drink plenty of water. Do not drink any water within one hour of the test. . Do not eat any food 4 hours prior to the test. . You may take your regular medications prior to the test.  . Take metoprolol (Lopressor) two hours prior to test. . HOLD Furosemide/Hydrochlorothiazide morning of the test. . FEMALES- please wear underwire-free bra if available  After the Test: . Drink plenty of water. . After receiving IV contrast, you may experience a mild flushed feeling. This is normal. . On occasion, you may experience a mild rash up to 24 hours after the test. This is not dangerous. If this occurs, you can take Benadryl 25 mg and increase your fluid intake. . If you experience trouble breathing, this can be serious. If it is severe call 911 IMMEDIATELY. If it is mild, please call our office. . If you take any of these medications: Glipizide/Metformin, Avandament, Glucavance, please do not take 48 hours after completing test unless otherwise instructed.   Once we have confirmed authorization from your insurance company, we will call you to set up a date and time for your test.   For non-scheduling related questions, please contact the cardiac imaging nurse navigator should you have any questions/concerns: Marchia Bond, RN Navigator Cardiac Imaging [facility] [phone] Office

## 2019-02-17 ENCOUNTER — Ambulatory Visit (HOSPITAL_COMMUNITY): Payer: Medicare Other | Attending: Cardiovascular Disease

## 2019-02-17 ENCOUNTER — Other Ambulatory Visit: Payer: Self-pay

## 2019-02-17 DIAGNOSIS — R079 Chest pain, unspecified: Secondary | ICD-10-CM | POA: Diagnosis present

## 2019-02-17 DIAGNOSIS — R0602 Shortness of breath: Secondary | ICD-10-CM | POA: Diagnosis present

## 2019-02-19 ENCOUNTER — Other Ambulatory Visit: Payer: Self-pay | Admitting: *Deleted

## 2019-02-19 MED ORDER — NITROGLYCERIN 0.4 MG SL SUBL: SUBLINGUAL_TABLET | SUBLINGUAL | 99 refills | Status: DC

## 2019-03-05 ENCOUNTER — Telehealth: Payer: Self-pay | Admitting: Cardiology

## 2019-03-05 NOTE — Telephone Encounter (Signed)
Patient asking to speak with Dr. Marlou Porch nurse, Jeannene Patella. Would not explain what it was in regards to.

## 2019-03-05 NOTE — Telephone Encounter (Signed)
Pt had questions related to upcoming scheduled CT scan.  He wanted to know how soon he need the lab work - advised it needs to be less that 69 weeks old.  Generally we have patient come the week before for the lab work.  He is asking the name of the contrast used for the study - advised it is contrast media however I do not know the specific name of it.  Requested he contact Marchia Bond, nurse navigator for cardiac imaging.  He thanked me for the call and information.  He will contact Marchia Bond as suggested.

## 2019-04-03 ENCOUNTER — Other Ambulatory Visit: Payer: Medicare Other

## 2019-04-08 ENCOUNTER — Other Ambulatory Visit (HOSPITAL_COMMUNITY): Payer: Medicare Other

## 2019-05-01 NOTE — Telephone Encounter (Signed)
Please set him up with a return visit appointment with me.  Thank you.  Donato Schultz, MD

## 2019-05-20 ENCOUNTER — Other Ambulatory Visit: Payer: Self-pay

## 2019-05-20 ENCOUNTER — Ambulatory Visit: Payer: Medicare Other | Admitting: Cardiology

## 2019-05-20 ENCOUNTER — Encounter: Payer: Self-pay | Admitting: Cardiology

## 2019-05-20 ENCOUNTER — Encounter: Payer: Self-pay | Admitting: Gastroenterology

## 2019-05-20 VITALS — BP 144/70 | HR 75 | Ht 67.0 in | Wt 174.0 lb

## 2019-05-20 DIAGNOSIS — I10 Essential (primary) hypertension: Secondary | ICD-10-CM

## 2019-05-20 DIAGNOSIS — R12 Heartburn: Secondary | ICD-10-CM | POA: Diagnosis not present

## 2019-05-20 MED ORDER — NITROGLYCERIN 0.4 MG SL SUBL: SUBLINGUAL_TABLET | SUBLINGUAL | 99 refills | Status: DC

## 2019-05-20 NOTE — Progress Notes (Signed)
Cardiology Office Note:    Date:  05/20/2019   ID:  Cameron Sullivan, DOB [birth year], MRN [id number]  PCP:  Bryson Corona, MD  Cardiologist:  Candee Furbish, MD  Electrophysiologist:  None   Referring MD: No ref. provider found     History of Present Illness:    Cameron Sullivan is a 64 y.o. male here for follow-up chest pain.  Was in the emergency room in October 2020 with chest pain shortness of breath left-sided 5 days intermittent achiness began after eating a salad with a boiled egg.  Has had GERD in the past.  Both his mother and father had hypertension. We ordered a coronary CT however it looks like he did not have it completed.  He also message me with symptoms that were reminiscent of his dad's Barrett's esophagus which led to cancer in his demise.  He was worrisome about doing a CT scan because of radiation fears.  We discussed the CT at length.  Hopefully he will reconsider.  He also told me tragedy behind his younger brother suicide after being diagnosed with bladder cancer.  He will occasionally feel chest pressure in the evening hours.  Never in the morning.  Since changing his diet at night, he thinks that it has improved.    Past Medical History:  Diagnosis Date  . Chest pain   . Hyperlipidemia   . Hypertension   . Noncompliance     History reviewed. No pertinent surgical history.  Current Medications: Current Meds  Medication Sig  . lisinopril-hydrochlorothiazide (PRINZIDE,ZESTORETIC) 20-12.5 MG tablet Take 1 tablet by mouth daily.  . nitroGLYCERIN (NITROSTAT) 0.4 MG SL tablet Take 1 tab under tongue every 5 min as needed for chest pain. Call 911 if symptoms remain 5 minutes after first administration.  . [DISCONTINUED] nitroGLYCERIN (NITROSTAT) 0.4 MG SL tablet Take 1 tab under tongue every 5 min as needed for chest pain. Call 911 if symptoms remain 5 minutes after first administration.     Allergies:   Patient has no known allergies.   Social History    Socioeconomic History  . Marital status: Married    Spouse name: Not on file  . Number of children: Not on file  . Years of education: Not on file  . Highest education level: Not on file  Occupational History  . Not on file  Tobacco Use  . Smoking status: Current Some Day Smoker  . Smokeless tobacco: Never Used  . Tobacco comment: smokes 1 cigar/wk x many years.  Substance and Sexual Activity  . Alcohol use: Yes    Alcohol/week: 7.0 standard drinks    Types: 7 Glasses of wine per week  . Drug use: No  . Sexual activity: Not on file  Other Topics Concern  . Not on file  Social History Narrative   Lives in [place] with wife.  Exercises regularly.   Social Determinants of Health   Financial Resource Strain:   . Difficulty of Paying Living Expenses: Not on file  Food Insecurity:   . Worried About Charity fundraiser in the Last Year: Not on file  . Ran Out of Food in the Last Year: Not on file  Transportation Needs:   . Lack of Transportation (Medical): Not on file  . Lack of Transportation (Non-Medical): Not on file  Physical Activity:   . Days of Exercise per Week: Not on file  . Minutes of Exercise per Session: Not on file  Stress:   . Feeling  of Stress : Not on file  Social Connections:   . Frequency of Communication with Friends and Family: Not on file  . Frequency of Social Gatherings with Friends and Family: Not on file  . Attends Religious Services: Not on file  . Active Member of Clubs or Organizations: Not on file  . Attends Banker Meetings: Not on file  . Marital Status: Not on file     Family History: The patient's family history includes Alzheimer's disease in his mother; Cancer in his father; Hyperlipidemia in his brother; Hypertension in his father and mother.  ROS:   Please see the history of present illness.     All other systems reviewed and are negative.  EKGs/Labs/Other Studies Reviewed:    The following studies were  reviewed today: As above  EKG:  EKG is  ordered today.  The ekg ordered today demonstrates normal sinus rhythm right bundle branch block 75 bpm  Recent Labs: 12/30/2018: BUN 10; Creatinine, Ser 1.18; Hemoglobin 16.5; Platelets 235; Potassium 3.2; Sodium 137  Recent Lipid Panel    Component Value Date/Time   CHOL 213 (H) 10/17/2013 0409   TRIG 184 (H) 10/17/2013 0409   HDL 54 10/17/2013 0409   CHOLHDL 3.9 10/17/2013 0409   VLDL 37 10/17/2013 0409   LDLCALC 122 (H) 10/17/2013 0409    Physical Exam:    VS:  BP (!) 144/70   Pulse 75   Ht 5\' 7"  (1.702 m)   Wt 174 lb (78.9 kg)   SpO2 98%   BMI 27.25 kg/m     Wt Readings from Last 3 Encounters:  05/20/19 174 lb (78.9 kg)  01/30/19 176 lb 12.8 oz (80.2 kg)  12/30/18 174 lb (78.9 kg)     GEN:  Well nourished, well developed in no acute distress HEENT: Normal NECK: No JVD; No carotid bruits LYMPHATICS: No lymphadenopathy CARDIAC: RRR, no murmurs, rubs, gallops RESPIRATORY:  Clear to auscultation without rales, wheezing or rhonchi  ABDOMEN: Soft, non-tender, non-distended MUSCULOSKELETAL:  No edema; No deformity  SKIN: Warm and dry NEUROLOGIC:  Alert and oriented x 3 PSYCHIATRIC:  Normal affect   ASSESSMENT:    1. Essential hypertension   2. Heartburn    PLAN:    In order of problems listed above:  Chest pain, somewhat atypical -Right bundle branch block noted on ECG. -Echocardiogram 02/2019 was reassuring with normal pump function. -He was worried about potential radiation from CT scan etc.  I reassured him that the radiation doses quite small, much smaller than our stress test.  Hopefully he will reconsider that way we can completely alleviate potential coronary artery disease.  He will let 03/2019 know.  GERD -Clearly does have some GERD-like symptoms and his change in diet has helped.  Hopefully this is the crux of his issues.  His father had Barrett's esophagus.  He would like to have a GI evaluation.  We will refer  him to [place], MD.-GI    Medication Adjustments/Labs and Tests Ordered: Current medicines are reviewed at length with the patient today.  Concerns regarding medicines are outlined above.  Orders Placed This Encounter  Procedures  . Ambulatory referral to Gastroenterology  . EKG 12-Lead   Meds ordered this encounter  Medications  . nitroGLYCERIN (NITROSTAT) 0.4 MG SL tablet    Sig: Take 1 tab under tongue every 5 min as needed for chest pain. Call 911 if symptoms remain 5 minutes after first administration.    Dispense:  25 tablet  Refill:  prn    Patient Instructions  Medication Instructions:  The current medical regimen is effective;  continue present plan and medications.  *If you need a refill on your cardiac medications before your next appointment, please call your pharmacy*  You have been referred to Dr Rob Bunting.  Follow-Up: At [facility], you and your health needs are our priority.  As part of our continuing mission to provide you with exceptional heart care, we have created designated Provider Care Teams.  These Care Teams include your primary Cardiologist (physician) and Advanced Practice Providers (APPs -  Physician Assistants and Nurse Practitioners) who all work together to provide you with the care you need, when you need it.  We recommend signing up for the patient portal called "MyChart".  Sign up information is provided on this After Visit Summary.  MyChart is used to connect with patients for Virtual Visits (Telemedicine).  Patients are able to view lab/test results, encounter notes, upcoming appointments, etc.  Non-urgent messages can be sent to your provider as well.   To learn more about what you can do with MyChart, go to [email or web address].    Your next appointment:   6 month(s)  The format for your next appointment:   In Person  Provider:   Nada Boozer, NP  Thank you for choosing [facility]!!        Signed, Donato Schultz, MD  05/20/2019 11:10 AM    [facility]

## 2019-05-20 NOTE — Patient Instructions (Signed)
Medication Instructions:  The current medical regimen is effective;  continue present plan and medications.  *If you need a refill on your cardiac medications before your next appointment, please call your pharmacy*  You have been referred to Dr Rob Bunting.  Follow-Up: At [facility], you and your health needs are our priority.  As part of our continuing mission to provide you with exceptional heart care, we have created designated Provider Care Teams.  These Care Teams include your primary Cardiologist (physician) and Advanced Practice Providers (APPs -  Physician Assistants and Nurse Practitioners) who all work together to provide you with the care you need, when you need it.  We recommend signing up for the patient portal called "MyChart".  Sign up information is provided on this After Visit Summary.  MyChart is used to connect with patients for Virtual Visits (Telemedicine).  Patients are able to view lab/test results, encounter notes, upcoming appointments, etc.  Non-urgent messages can be sent to your provider as well.   To learn more about what you can do with MyChart, go to [email or web address].    Your next appointment:   6 month(s)  The format for your next appointment:   In Person  Provider:   Nada Boozer, NP  Thank you for choosing [facility]!!

## 2019-06-17 DIAGNOSIS — R12 Heartburn: Secondary | ICD-10-CM

## 2019-06-17 HISTORY — DX: Heartburn: R12

## 2019-06-24 ENCOUNTER — Ambulatory Visit: Payer: Medicare Other | Admitting: Gastroenterology

## 2019-06-24 ENCOUNTER — Encounter: Payer: Self-pay | Admitting: Gastroenterology

## 2019-06-24 VITALS — BP 154/80 | HR 76 | Temp 98.5°F | Ht 65.0 in | Wt 170.1 lb

## 2019-06-24 DIAGNOSIS — R1013 Epigastric pain: Secondary | ICD-10-CM | POA: Diagnosis not present

## 2019-06-24 DIAGNOSIS — Z01818 Encounter for other preprocedural examination: Secondary | ICD-10-CM

## 2019-06-24 DIAGNOSIS — K219 Gastro-esophageal reflux disease without esophagitis: Secondary | ICD-10-CM

## 2019-06-24 NOTE — Patient Instructions (Signed)
If you are age 66 or older, your body mass index should be between 23-30. Your Body mass index is 28.31 kg/m. If this is out of the aforementioned range listed, please consider follow up with your Primary Care Provider.  If you are age 36 or younger, your body mass index should be between 19-25. Your Body mass index is 28.31 kg/m. If this is out of the aformentioned range listed, please consider follow up with your Primary Care Provider.   You have been scheduled for an endoscopy. Please follow written instructions given to you at your visit today. If you use inhalers (even only as needed), please bring them with you on the day of your procedure.  Thank you for entrusting me with your care and choosing [facility].  Dr Christella Hartigan

## 2019-06-24 NOTE — Progress Notes (Signed)
HPI: This is a pleasant 64 year old man who was referred to me by Jake Bathe, MD  to evaluate atypical chest pain, likely GERD.    He is a bit of a scattered historian but it seems over many years he has had intermittent issues with chest discomforts, belching, heartburn, bloating and gassiness.  He used to be bothered by dysphagia but that has not bothered him in 8 to 10 years now.  He has had frank heartburn especially when bending over.  He does not believe that proton pump inhibitors have ever really helped him.  He has really changed his diet over the past couple years and has lost 25 [place] in the past 2 years.  He saw cardiologist Dr. Anne Fu for his atypical chest pains.  EKG and echocardiogram were essentially normal he was recommended to have CT scanning of his chest but he declined that.  His father had Barrett's esophagus and some sort of upper intestinal bleed  Old Data Reviewed: Blood work October 2020 showed normal CBC, normal complete metabolic profile except for potassium 3.2. Essentially normal EKG and echocardiogram within the past 2 to 3 months   Review of systems: Pertinent positive and negative review of systems were noted in the above HPI section. All other review negative.   Past Medical History:  Diagnosis Date  . Chest pain   . GERD (gastroesophageal reflux disease)   . Heartburn 06/17/2019  . Hyperlipidemia   . Hypertension   . Noncompliance     Past Surgical History:  Procedure Laterality Date  . NO PAST SURGERIES      Current Outpatient Medications  Medication Sig Dispense Refill  . lisinopril-hydrochlorothiazide (PRINZIDE,ZESTORETIC) 20-12.5 MG tablet Take 1 tablet by mouth daily.  1  . LORazepam (ATIVAN) 1 MG tablet Take 1 mg by mouth at bedtime as needed for sleep.     . nitroGLYCERIN (NITROSTAT) 0.4 MG SL tablet Take 1 tab under tongue every 5 min as needed for chest pain. Call 911 if symptoms remain 5 minutes after first administration.  (Patient not taking: Reported on 06/24/2019) 25 tablet prn   No current facility-administered medications for this visit.    Allergies as of 06/24/2019  . (No Known Allergies)    Family History  Problem Relation Age of Onset  . Hypertension Father   . Barrett's esophagus Father   . Esophageal cancer Father        died @ 39  . Hypertension Mother        alive in her 77s  . Alzheimer's disease Mother   . Hyperlipidemia Brother   . Bladder Cancer Brother   . Lung cancer Paternal Grandmother        smoker    Social History   Socioeconomic History  . Marital status: Married    Spouse name: Not on file  . Number of children: Not on file  . Years of education: Not on file  . Highest education level: Not on file  Occupational History  . Not on file  Tobacco Use  . Smoking status: Current Some Day Smoker  . Smokeless tobacco: Never Used  . Tobacco comment: smokes 1 cigar/wk x many years.  Substance and Sexual Activity  . Alcohol use: Yes    Alcohol/week: 7.0 standard drinks    Types: 7 Glasses of wine per week  . Drug use: No  . Sexual activity: Not on file  Other Topics Concern  . Not on file  Social History Narrative  Lives in [place] with wife.  Exercises regularly.   Social Determinants of Health   Financial Resource Strain:   . Difficulty of Paying Living Expenses:   Food Insecurity:   . Worried About Charity fundraiser in the Last Year:   . Arboriculturist in the Last Year:   Transportation Needs:   . Film/video editor (Medical):   Marland Kitchen Lack of Transportation (Non-Medical):   Physical Activity:   . Days of Exercise per Week:   . Minutes of Exercise per Session:   Stress:   . Feeling of Stress :   Social Connections:   . Frequency of Communication with Friends and Family:   . Frequency of Social Gatherings with Friends and Family:   . Attends Religious Services:   . Active Member of Clubs or Organizations:   . Attends Archivist  Meetings:   Marland Kitchen Marital Status:   Intimate Partner Violence:   . Fear of Current or Ex-Partner:   . Emotionally Abused:   Marland Kitchen Physically Abused:   . Sexually Abused:      Physical Exam: Temp 98.5 F (36.9 C)   Ht 5\' 5"  (1.651 m) Comment: height measured without shoes  Wt 170 lb 2 oz (77.2 kg)   BMI 28.31 kg/m  Constitutional: generally well-appearing Psychiatric: alert and oriented x3 Eyes: extraocular movements intact Mouth: oral pharynx moist, no lesions Neck: supple no lymphadenopathy Cardiovascular: heart regular rate and rhythm Lungs: clear to auscultation bilaterally Abdomen: soft, nontender, nondistended, no obvious ascites, no peritoneal signs, normal bowel sounds Extremities: no lower extremity edema bilaterally Skin: no lesions on visible extremities   Assessment and plan: 64 y.o. male with atypical chest pain, dyspepsia, GERD-like issues over many years  ~Proton pump inhibitors it never really helped but his symptoms are under much better control with some significant dietary modifications that he has made.  I recommended EGD at his soonest convenience to exclude significant GERD related complications, Barrett's esophagus, perhaps H. pylori.  I see no reason for any further blood tests or imaging studies prior to the    Please see the "Patient Instructions" section for addition details about the plan.   Owens Loffler, MD [facility] 06/24/2019, 10:42 AM  Cc: Jerline Pain, MD  Total time on date of encounter was 60  minutes (this included time spent preparing to see the patient reviewing records; obtaining and/or reviewing separately obtained history; performing a medically appropriate exam and/or evaluation; counseling and educating the patient and family if present; ordering medications, tests or procedures if applicable; and documenting clinical information in the health record).

## 2019-07-03 ENCOUNTER — Encounter: Payer: Self-pay | Admitting: Gastroenterology

## 2019-07-06 ENCOUNTER — Ambulatory Visit (INDEPENDENT_AMBULATORY_CARE_PROVIDER_SITE_OTHER): Payer: Medicare Other

## 2019-07-06 ENCOUNTER — Other Ambulatory Visit: Payer: Self-pay | Admitting: Gastroenterology

## 2019-07-06 DIAGNOSIS — Z1159 Encounter for screening for other viral diseases: Secondary | ICD-10-CM

## 2019-07-06 LAB — SARS CORONAVIRUS 2 (TAT 6-24 HRS): SARS Coronavirus 2: NEGATIVE

## 2019-07-08 ENCOUNTER — Other Ambulatory Visit: Payer: Self-pay

## 2019-07-08 ENCOUNTER — Encounter: Payer: Self-pay | Admitting: Gastroenterology

## 2019-07-08 ENCOUNTER — Ambulatory Visit (AMBULATORY_SURGERY_CENTER): Payer: Medicare Other | Admitting: Gastroenterology

## 2019-07-08 VITALS — BP 138/84 | HR 63 | Temp 95.9°F | Resp 15 | Ht 65.0 in | Wt 170.0 lb

## 2019-07-08 DIAGNOSIS — K2990 Gastroduodenitis, unspecified, without bleeding: Secondary | ICD-10-CM

## 2019-07-08 DIAGNOSIS — K2970 Gastritis, unspecified, without bleeding: Secondary | ICD-10-CM | POA: Diagnosis not present

## 2019-07-08 DIAGNOSIS — K2950 Unspecified chronic gastritis without bleeding: Secondary | ICD-10-CM

## 2019-07-08 DIAGNOSIS — R1013 Epigastric pain: Secondary | ICD-10-CM

## 2019-07-08 MED ORDER — SODIUM CHLORIDE 0.9 % IV SOLN: 500.0000 mL | Freq: Once | INTRAVENOUS | Status: AC

## 2019-07-08 NOTE — Progress Notes (Signed)
Report to PACU, RN, vss, BBS= Clear.  

## 2019-07-08 NOTE — Patient Instructions (Signed)
Biopsies taken for gastritis.  Await pathology for final recommendations.  Handouts on findings given to patient.    YOU HAD AN ENDOSCOPIC PROCEDURE TODAY AT THE [facility]:   Refer to the procedure report that was given to you for any specific questions about what was found during the examination.  If the procedure report does not answer your questions, please call your gastroenterologist to clarify.  If you requested that your care partner not be given the details of your procedure findings, then the procedure report has been included in a sealed envelope for you to review at your convenience later.  YOU SHOULD EXPECT: Some feelings of bloating in the abdomen. Passage of more gas than usual.  Walking can help get rid of the air that was put into your GI tract during the procedure and reduce the bloating. If you had a lower endoscopy (such as a colonoscopy or flexible sigmoidoscopy) you may notice spotting of blood in your stool or on the toilet paper. If you underwent a bowel prep for your procedure, you may not have a normal bowel movement for a few days.  Please Note:  You might notice some irritation and congestion in your nose or some drainage.  This is from the oxygen used during your procedure.  There is no need for concern and it should clear up in a day or so.  SYMPTOMS TO REPORT IMMEDIATELY:   Following upper endoscopy (EGD)  Vomiting of blood or coffee ground material  New chest pain or pain under the shoulder blades  Painful or persistently difficult swallowing  New shortness of breath  Fever of 100F or higher  Black, tarry-looking stools  For urgent or emergent issues, a gastroenterologist can be reached at any hour by calling [phone]. Do not use MyChart messaging for urgent concerns.    DIET:  We do recommend a small meal at first, but then you may proceed to your regular diet.  Drink plenty of fluids but you should avoid alcoholic beverages for 24  hours.  ACTIVITY:  You should plan to take it easy for the rest of today and you should NOT DRIVE or use heavy machinery until tomorrow (because of the sedation medicines used during the test).    FOLLOW UP: Our staff will call the number listed on your records 48-72 hours following your procedure to check on you and address any questions or concerns that you may have regarding the information given to you following your procedure. If we do not reach you, we will leave a message.  We will attempt to reach you two times.  During this call, we will ask if you have developed any symptoms of COVID 19. If you develop any symptoms (ie: fever, flu-like symptoms, shortness of breath, cough etc.) before then, please call [phone].  If you test positive for Covid 19 in the 2 weeks post procedure, please call and report this information to [place].    If any biopsies were taken you will be contacted by phone or by letter within the next 1-3 weeks.  Please call us at [phone] if you have not heard about the biopsies in 3 weeks.    SIGNATURES/CONFIDENTIALITY: You and/or your care partner have signed paperwork which will be entered into your electronic medical record.  These signatures attest to the fact that that the information above on your After Visit Summary has been reviewed and is understood.  Full responsibility of the confidentiality of this discharge information  lies with you and/or your care-partner.

## 2019-07-08 NOTE — Progress Notes (Signed)
Called to room to assist during endoscopic procedure.  Patient ID and intended procedure confirmed with present staff. Received instructions for my participation in the procedure from the performing physician.  

## 2019-07-08 NOTE — Op Note (Signed)
[facility] Patient Name: Cameron Sullivan Procedure Date: 07/08/2019 9:28 AM MRN: [id number] Endoscopist: Rachael Fee , MD Age: 64 Referring MD:  Date of Birth: [birth year] Gender: Male Account #: [id number] Procedure:                Upper GI endoscopy Indications:              atypical chest pain Medicines:                Monitored Anesthesia Care Procedure:                Pre-Anesthesia Assessment:                           - Prior to the procedure, a History and Physical                            was performed, and patient medications and                            allergies were reviewed. The patient's tolerance of                            previous anesthesia was also reviewed. The risks                            and benefits of the procedure and the sedation                            options and risks were discussed with the patient.                            All questions were answered, and informed consent                            was obtained. Prior Anticoagulants: The patient has                            taken no previous anticoagulant or antiplatelet                            agents. ASA Grade Assessment: II - A patient with                            mild systemic disease. After reviewing the risks                            and benefits, the patient was deemed in                            satisfactory condition to undergo the procedure.                           After obtaining informed consent, the endoscope was  passed under direct vision. Throughout the                            procedure, the patient's blood pressure, pulse, and                            oxygen saturations were monitored continuously. The                            Endoscope was introduced through the mouth, and                            advanced to the second part of duodenum. The upper                            GI endoscopy was accomplished without  difficulty.                            The patient tolerated the procedure well. Scope In: Scope Out: Findings:                 Minimal inflammation characterized by erythema was                            found in the gastric body and in the gastric                            antrum. Biopsies were taken with a cold forceps for                            histology.                           The exam was otherwise without abnormality. Complications:            No immediate complications. Estimated blood loss:                            None. Estimated Blood Loss:     Estimated blood loss: none. Impression:               - Mild non-specific distal gastritis, biopsied to                            check for H. pylori.                           - The examination was otherwise normal. Recommendation:           - Patient has a contact number available for                            emergencies. The signs and symptoms of potential                            delayed complications were discussed with the  patient. Return to normal activities tomorrow.                            Written discharge instructions were provided to the                            patient.                           - Resume previous diet.                           - Continue present medications.                           - Await pathology results. Milus Banister, MD 07/08/2019 9:44:01 AM This report has been signed electronically.

## 2019-07-08 NOTE — Progress Notes (Signed)
Pt's states no medical or surgical changes since previsit or office visit. DT vitals, LC temp and KW IV.

## 2019-07-10 ENCOUNTER — Telehealth: Payer: Self-pay

## 2019-07-10 ENCOUNTER — Telehealth: Payer: Self-pay | Admitting: *Deleted

## 2019-07-10 NOTE — Telephone Encounter (Signed)
1. Have you developed a fever since your procedure? no  2.   Have you had an respiratory symptoms (SOB or cough) since your procedure? no  3.   Have you tested positive for COVID 19 since your procedure no  4.   Have you had any family members/close contacts diagnosed with the COVID 19 since your procedure?  no   If yes to any of these questions please route to Laverna Peace, RN and Charlett Lango, RN Follow up Call-  Call back number 07/08/2019  Post procedure Call Back phone  # [phone]  Permission to leave phone message Yes  Some recent data might be hidden     Patient questions:  Do you have a fever, pain , or abdominal swelling? No. Pain Score  0 *  Have you tolerated food without any problems? Yes.    Have you been able to return to your normal activities? Yes.    Do you have any questions about your discharge instructions: Diet   No. Medications  No. Follow up visit  No.  Do you have questions or concerns about your Care? No.  Actions: * If pain score is 4 or above: No action needed, pain <4.

## 2019-07-10 NOTE — Telephone Encounter (Signed)
NO ANSWER, MESSAGE LEFT FOR PATIENT. 

## 2019-07-15 NOTE — Telephone Encounter (Signed)
Ok to schedule an office visit Donato Schultz, MD

## 2019-07-16 ENCOUNTER — Encounter: Payer: Self-pay | Admitting: Gastroenterology

## 2019-08-11 ENCOUNTER — Encounter: Payer: Medicare Other | Admitting: Cardiology

## 2019-08-11 ENCOUNTER — Other Ambulatory Visit: Payer: Self-pay

## 2019-08-11 ENCOUNTER — Telehealth: Payer: Self-pay | Admitting: Cardiology

## 2019-08-11 VITALS — Ht 65.0 in

## 2019-08-11 NOTE — Telephone Encounter (Signed)
   He came in for his appointment but immediately went to the elevator without checking in downstairs.  Went up to the lobby and immediately sat down without talking to anybody.  After sitting for a while, finally went up to front desk and checked in with [place] but then decided to leave without being seen.  Donato Schultz, MD

## 2019-10-14 ENCOUNTER — Ambulatory Visit: Payer: Medicare Other | Admitting: Cardiology

## 2019-10-14 NOTE — Progress Notes (Deleted)
Cardiology Office Note:    Date:  10/14/2019   ID:  Cameron Sullivan, DOB [birth year], MRN [id number]  PCP:  Gerarda Gunther, MD  [facility] Cardiologist:  Donato Schultz, MD  [facility] Electrophysiologist:  None   Referring MD: Gerarda Gunther, MD   No chief complaint on file. ***  History of Present Illness:    Cameron Sullivan is a 64 y.o. male here for follow-up chest discomfort.  Back in October 2020 was in the chest pain unit at hospital with left-sided 5 days of intermittent achiness that began after eating a salad with broiled eggs.  Has had GERD in the past.  Was evaluated by Dr. Christella Hartigan as well.  GI.  EGD showed no signs of hiatal hernia.  He was recommended to continue with acid control.  Father and mother have hypertension  He felt as though his symptoms were reminiscent of his dad's Barrett esophagus which led to cancer in his demise.  He was worried about this.  His younger brother tragically died from suicide after being diagnosed with bladder cancer.  I recommended coronary CT scan but he was worried about radiation.  He did not follow through with the scan.  He did have an EKG and echocardiogram which were essentially normal.  Back on Aug 11, 2019 he came in for his appointment but immediately went to the elevator without checking in downstairs, went up to the lobby sat down without talking to anybody.  After sitting for a while he finally went up to the front desk checked in with [place] but then decided to leave without being seen.  Then rescheduled for this date.  He left a patient message that he was stung by several yellow jackets and in pain 2 days ago.  He needs to reschedule.  Past Medical History:  Diagnosis Date  . Chest pain   . GERD (gastroesophageal reflux disease)   . Heartburn 06/17/2019  . Hyperlipidemia   . Hypertension   . Noncompliance     Past Surgical History:  Procedure Laterality Date  . NO PAST SURGERIES      Current Medications: No  outpatient medications have been marked as taking for the 10/14/19 encounter (Appointment) with Jake Bathe, MD.   Current Facility-Administered Medications for the 10/14/19 encounter (Appointment) with Jake Bathe, MD  Medication  . 0.9 %  sodium chloride infusion     Allergies:   Patient has no known allergies.   Social History   Socioeconomic History  . Marital status: Married    Spouse name: Not on file  . Number of children: 0  . Years of education: Not on file  . Highest education level: Not on file  Occupational History  . Occupation: retired  Tobacco Use  . Smoking status: Light Tobacco Smoker    Types: Cigars  . Smokeless tobacco: Never Used  . Tobacco comment: smokes 1 cigar/wk x many years.  Vaping Use  . Vaping Use: Never used  Substance and Sexual Activity  . Alcohol use: Not Currently    Alcohol/week: 7.0 standard drinks    Types: 7 Glasses of wine per week    Comment: wine or beer occasional  . Drug use: No  . Sexual activity: Not on file  Other Topics Concern  . Not on file  Social History Narrative   Lives in [place] with wife.  Exercises regularly.   Social Determinants of Health   Financial Resource Strain:   . Difficulty of  Paying Living Expenses:   Food Insecurity:   . Worried About Programme researcher, broadcasting/film/video in the Last Year:   . Barista in the Last Year:   Transportation Needs:   . Freight forwarder (Medical):   Marland Kitchen Lack of Transportation (Non-Medical):   Physical Activity:   . Days of Exercise per Week:   . Minutes of Exercise per Session:   Stress:   . Feeling of Stress :   Social Connections:   . Frequency of Communication with Friends and Family:   . Frequency of Social Gatherings with Friends and Family:   . Attends Religious Services:   . Active Member of Clubs or Organizations:   . Attends Banker Meetings:   Marland Kitchen Marital Status:      Family History: The patient's ***family history includes  Alzheimer's disease in his mother; Barrett's esophagus in his father; Bladder Cancer in his brother; Esophageal cancer in his father; Hyperlipidemia in his brother; Hypertension in his father and mother; Lung cancer in his paternal grandmother. There is no history of Stomach cancer, Rectal cancer, or Colon cancer.  ROS:   Please see the history of present illness.    *** All other systems reviewed and are negative.  EKGs/Labs/Other Studies Reviewed:    The following studies were reviewed today: ***  EKG:  EKG is *** ordered today.  The ekg ordered today demonstrates ***  Recent Labs: 12/30/2018: BUN 10; Creatinine, Ser 1.18; Hemoglobin 16.5; Platelets 235; Potassium 3.2; Sodium 137  Recent Lipid Panel    Component Value Date/Time   CHOL 213 (H) 10/17/2013 0409   TRIG 184 (H) 10/17/2013 0409   HDL 54 10/17/2013 0409   CHOLHDL 3.9 10/17/2013 0409   VLDL 37 10/17/2013 0409   LDLCALC 122 (H) 10/17/2013 0409    Physical Exam:    VS:  There were no vitals taken for this visit.    Wt Readings from Last 3 Encounters:  07/08/19 170 lb (77.1 kg)  06/24/19 170 lb 2 oz (77.2 kg)  05/20/19 174 lb (78.9 kg)     GEN: *** Well nourished, well developed in no acute distress HEENT: Normal NECK: No JVD; No carotid bruits LYMPHATICS: No lymphadenopathy CARDIAC: ***RRR, no murmurs, rubs, gallops RESPIRATORY:  Clear to auscultation without rales, wheezing or rhonchi  ABDOMEN: Soft, non-tender, non-distended MUSCULOSKELETAL:  No edema; No deformity  SKIN: Warm and dry NEUROLOGIC:  Alert and oriented x 3 PSYCHIATRIC:  Normal affect   ASSESSMENT:    No diagnosis found. PLAN:    In order of problems listed above:  1. ***   Medication Adjustments/Labs and Tests Ordered: Current medicines are reviewed at length with the patient today.  Concerns regarding medicines are outlined above.  No orders of the defined types were placed in this encounter.  No orders of the defined types were  placed in this encounter.   There are no Patient Instructions on file for this visit.   Signed, Donato Schultz, MD  10/14/2019 9:14 AM    [facility]

## 2019-12-08 ENCOUNTER — Ambulatory Visit: Payer: Medicare Other | Admitting: Cardiology

## 2019-12-08 ENCOUNTER — Encounter: Payer: Self-pay | Admitting: Cardiology

## 2019-12-08 ENCOUNTER — Other Ambulatory Visit: Payer: Self-pay

## 2019-12-08 VITALS — BP 140/78 | HR 83 | Ht 65.0 in | Wt 168.0 lb

## 2019-12-08 DIAGNOSIS — I10 Essential (primary) hypertension: Secondary | ICD-10-CM

## 2019-12-08 DIAGNOSIS — R079 Chest pain, unspecified: Secondary | ICD-10-CM | POA: Diagnosis not present

## 2019-12-08 NOTE — Progress Notes (Signed)
Cardiology Office Note:    Date:  12/08/2019   ID:  Cameron Sullivan, DOB [birth year], MRN [id number]  PCP:  Gerarda Gunther, MD  [facility] Cardiologist:  Donato Schultz, MD  [facility] Electrophysiologist:  None   Referring MD: Gerarda Gunther, MD     History of Present Illness:    Cameron Sullivan is a 64 y.o. male here for follow-up of anginal symptoms. Patient did not wish to go forward with CT scan. Has seen GI, Dr. Christella Hartigan.  Please see below for details.  Currently not having any chest pain fevers chills nausea vomiting shortness of breath.  Past Medical History:  Diagnosis Date  . Chest pain   . GERD (gastroesophageal reflux disease)   . Heartburn 06/17/2019  . Hyperlipidemia   . Hypertension   . Noncompliance     Past Surgical History:  Procedure Laterality Date  . NO PAST SURGERIES      Current Medications: No outpatient medications have been marked as taking for the 12/08/19 encounter (Office Visit) with Jake Bathe, MD.   Current Facility-Administered Medications for the 12/08/19 encounter (Office Visit) with Jake Bathe, MD  Medication  . 0.9 %  sodium chloride infusion     Allergies:   Patient has no known allergies.   Social History   Socioeconomic History  . Marital status: Married    Spouse name: Not on file  . Number of children: 0  . Years of education: Not on file  . Highest education level: Not on file  Occupational History  . Occupation: retired  Tobacco Use  . Smoking status: Light Tobacco Smoker    Types: Cigars  . Smokeless tobacco: Never Used  . Tobacco comment: smokes 1 cigar/wk x many years.  Vaping Use  . Vaping Use: Never used  Substance and Sexual Activity  . Alcohol use: Not Currently    Alcohol/week: 7.0 standard drinks    Types: 7 Glasses of wine per week    Comment: wine or beer occasional  . Drug use: No  . Sexual activity: Not on file  Other Topics Concern  . Not on file  Social History Narrative   Lives in  [place] with wife.  Exercises regularly.   Social Determinants of Health   Financial Resource Strain:   . Difficulty of Paying Living Expenses: Not on file  Food Insecurity:   . Worried About Programme researcher, broadcasting/film/video in the Last Year: Not on file  . Ran Out of Food in the Last Year: Not on file  Transportation Needs:   . Lack of Transportation (Medical): Not on file  . Lack of Transportation (Non-Medical): Not on file  Physical Activity:   . Days of Exercise per Week: Not on file  . Minutes of Exercise per Session: Not on file  Stress:   . Feeling of Stress : Not on file  Social Connections:   . Frequency of Communication with Friends and Family: Not on file  . Frequency of Social Gatherings with Friends and Family: Not on file  . Attends Religious Services: Not on file  . Active Member of Clubs or Organizations: Not on file  . Attends Banker Meetings: Not on file  . Marital Status: Not on file     Family History: The patient's family history includes Alzheimer's disease in his mother; Barrett's esophagus in his father; Bladder Cancer in his brother; Esophageal cancer in his father; Hyperlipidemia in his brother; Hypertension in his father  and mother; Lung cancer in his paternal grandmother. There is no history of Stomach cancer, Rectal cancer, or Colon cancer.  ROS:   Please see the history of present illness.     All other systems reviewed and are negative.  EKGs/Labs/Other Studies Reviewed:   Recent Labs: 01/21/2019: BUN 10; Creatinine, Ser 1.18; Hemoglobin 16.5; Platelets 235; Potassium 3.2; Sodium 137  Recent Lipid Panel    Component Value Date/Time   CHOL 213 (H) 10/17/2013 0409   TRIG 184 (H) 10/17/2013 0409   HDL 54 10/17/2013 0409   CHOLHDL 3.9 10/17/2013 0409   VLDL 37 10/17/2013 0409   LDLCALC 122 (H) 10/17/2013 0409    Physical Exam:    VS:  BP 140/78   Pulse 83   Ht 5\' 5"  (1.651 m)   Wt 168 lb (76.2 kg)   SpO2 97%   BMI 27.96 kg/m      Wt Readings from Last 3 Encounters:  12/08/19 168 lb (76.2 kg)  07/08/19 170 lb (77.1 kg)  06/24/19 170 lb 2 oz (77.2 kg)     GEN:  Well nourished, well developed in no acute distress HEENT: Normal NECK: No JVD; No carotid bruits LYMPHATICS: No lymphadenopathy CARDIAC: RRR, no murmurs, rubs, gallops RESPIRATORY:  Clear to auscultation without rales, wheezing or rhonchi  ABDOMEN: Soft, non-tender, non-distended MUSCULOSKELETAL:  No edema; No deformity  SKIN: Warm and dry NEUROLOGIC:  Alert and oriented x 3 PSYCHIATRIC:  Normal affect   ASSESSMENT:    1. Chest pain of uncertain etiology   2. Essential hypertension    PLAN:    In order of problems listed above:  Angina --Took NTG 4 month. SOB when going to bank. Anxious.  I am okay with him refilling the nitroglycerin.  GERD --dropped 25 [place]. Better now.  Continue with omeprazole, Pepcid.  Dr. 08/24/19 EGD reassuring.  HTN -Continue with lisinopril hydrochlorothiazide.  Blood pressure slightly elevated today.  Continue to monitor at home.  Creatinine 1.1     Medication Adjustments/Labs and Tests Ordered: Current medicines are reviewed at length with the patient today.  Concerns regarding medicines are outlined above.  No orders of the defined types were placed in this encounter.  No orders of the defined types were placed in this encounter.   Patient Instructions  Medication Instructions:  The current medical regimen is effective;  continue present plan and medications.  *If you need a refill on your cardiac medications before your next appointment, please call your pharmacy*  Follow-Up: At [facility], you and your health needs are our priority.  As part of our continuing mission to provide you with exceptional heart care, we have created designated Provider Care Teams.  These Care Teams include your primary Cardiologist (physician) and Advanced Practice Providers (APPs -  Physician Assistants and Nurse  Practitioners) who all work together to provide you with the care you need, when you need it.  We recommend signing up for the patient portal called "MyChart".  Sign up information is provided on this After Visit Summary.  MyChart is used to connect with patients for Virtual Visits (Telemedicine).  Patients are able to view lab/test results, encounter notes, upcoming appointments, etc.  Non-urgent messages can be sent to your provider as well.   To learn more about what you can do with MyChart, go to [facility].    Your next appointment:   12 month(s)  The format for your next appointment:   In Person  Provider:   [email or web address],  MD  Thank you for choosing [facility]!!        Signed, Donato Schultz, MD  12/08/2019 3:38 PM    [facility]

## 2019-12-08 NOTE — Patient Instructions (Signed)
Medication Instructions:  The current medical regimen is effective;  continue present plan and medications.  *If you need a refill on your cardiac medications before your next appointment, please call your pharmacy*  Follow-Up: At [facility], you and your health needs are our priority.  As part of our continuing mission to provide you with exceptional heart care, we have created designated Provider Care Teams.  These Care Teams include your primary Cardiologist (physician) and Advanced Practice Providers (APPs -  Physician Assistants and Nurse Practitioners) who all work together to provide you with the care you need, when you need it.  We recommend signing up for the patient portal called "MyChart".  Sign up information is provided on this After Visit Summary.  MyChart is used to connect with patients for Virtual Visits (Telemedicine).  Patients are able to view lab/test results, encounter notes, upcoming appointments, etc.  Non-urgent messages can be sent to your provider as well.   To learn more about what you can do with MyChart, go to [email or web address].    Your next appointment:   12 month(s)  The format for your next appointment:   In Person  Provider:   Mark Skains, MD   Thank you for choosing [facility]!!      

## 2020-02-25 ENCOUNTER — Other Ambulatory Visit: Payer: Self-pay | Admitting: *Deleted

## 2020-02-25 MED ORDER — LISINOPRIL-HYDROCHLOROTHIAZIDE 20-12.5 MG PO TABS: 1.0000 | ORAL_TABLET | Freq: Every day | ORAL | 3 refills | Status: DC

## 2021-03-21 ENCOUNTER — Other Ambulatory Visit: Payer: Self-pay

## 2021-03-21 MED ORDER — LISINOPRIL-HYDROCHLOROTHIAZIDE 20-12.5 MG PO TABS: 1.0000 | ORAL_TABLET | Freq: Every day | ORAL | 0 refills | Status: DC

## 2021-04-17 ENCOUNTER — Ambulatory Visit: Payer: Medicare Other | Admitting: Cardiology

## 2021-04-18 ENCOUNTER — Encounter: Payer: Self-pay | Admitting: Cardiology

## 2021-04-18 ENCOUNTER — Other Ambulatory Visit: Payer: Self-pay

## 2021-04-18 ENCOUNTER — Ambulatory Visit: Payer: Medicare Other | Admitting: Cardiology

## 2021-04-18 DIAGNOSIS — I4510 Unspecified right bundle-branch block: Secondary | ICD-10-CM

## 2021-04-18 DIAGNOSIS — E7800 Pure hypercholesterolemia, unspecified: Secondary | ICD-10-CM

## 2021-04-18 DIAGNOSIS — I208 Other forms of angina pectoris: Secondary | ICD-10-CM | POA: Diagnosis not present

## 2021-04-18 DIAGNOSIS — I10 Essential (primary) hypertension: Secondary | ICD-10-CM

## 2021-04-18 MED ORDER — LISINOPRIL-HYDROCHLOROTHIAZIDE 20-12.5 MG PO TABS: 1.0000 | ORAL_TABLET | Freq: Every day | ORAL | 3 refills | Status: DC

## 2021-04-18 MED ORDER — NITROGLYCERIN 0.4 MG SL SUBL: SUBLINGUAL_TABLET | SUBLINGUAL | 99 refills | Status: DC

## 2021-04-18 NOTE — Assessment & Plan Note (Signed)
Chronic, no high risk symptoms such as syncope.

## 2021-04-18 NOTE — Assessment & Plan Note (Addendum)
Takes nitroglycerin.  Anxious.  Did not wish to proceed with further testing.  Echocardiogram reviewed shows normal pump function.  We will refill nitroglycerin.  He has not needed it in quite some time.  He also did not tolerate amlodipine well.  Made him feel like his neck was swelling.

## 2021-04-18 NOTE — Assessment & Plan Note (Signed)
Continue with current medications lisinopril hydrochlorothiazide 20/12.5 for medical management.  Diet, exercise.

## 2021-04-18 NOTE — Patient Instructions (Signed)
Medication Instructions:   Your physician recommends that you continue on your current medications as directed. Please refer to the Current Medication list given to you today.  *If you need a refill on your cardiac medications before your next appointment, please call your pharmacy*   Lab Work: NONE ORDERED  TODAY   If you have labs (blood work) drawn today and your tests are completely normal, you will receive your results only by: MyChart Message (if you have MyChart) OR A paper copy in the mail If you have any lab test that is abnormal or we need to change your treatment, we will call you to review the results.   Testing/Procedures: NONE ORDERED  TODAY    Follow-Up: At [facility], you and your health needs are our priority.  As part of our continuing mission to provide you with exceptional heart care, we have created designated Provider Care Teams.  These Care Teams include your primary Cardiologist (physician) and Advanced Practice Providers (APPs -  Physician Assistants and Nurse Practitioners) who all work together to provide you with the care you need, when you need it.  We recommend signing up for the patient portal called "MyChart".  Sign up information is provided on this After Visit Summary.  MyChart is used to connect with patients for Virtual Visits (Telemedicine).  Patients are able to view lab/test results, encounter notes, upcoming appointments, etc.  Non-urgent messages can be sent to your provider as well.   To learn more about what you can do with MyChart, go to [email or web address].    Your next appointment:   1 year(s)  The format for your next appointment:   In Person  Provider:   Mark Skains, MD   Other Instructions   

## 2021-04-18 NOTE — Progress Notes (Signed)
Cardiology Office Note:    Date:  04/18/2021   ID:  Cameron Sullivan, DOB 08-19-1955, MRN [id number]  PCP:  Bryson Corona, MD   [facility] Providers Cardiologist:  Candee Furbish, MD     Referring MD: Bryson Corona, MD    History of Present Illness:    Cameron Sullivan is a 66 y.o. male here for the follow-up of anginal type symptoms.  Previously did not wish to proceed with coronary CT scan.  Has seen Dr. Ardis Hughs for GI evaluation.  Stopped sweets. Feels good. Weight is stable past 2 years. NTG has not needed. Will renew.  He likes it for security reasons  Anxiety.  His father died of esophageal cancer and had weight loss.  He is worried about that.  His weight is stable.  BP at home 120/70's on lisinopril hydrochlorothiazide.   Past Medical History:  Diagnosis Date   Chest pain    GERD (gastroesophageal reflux disease)    Heartburn 06/17/2019   Hyperlipidemia    Hypertension    Noncompliance     Past Surgical History:  Procedure Laterality Date   NO PAST SURGERIES      Current Medications: Current Meds  Medication Sig   LORazepam (ATIVAN) 1 MG tablet Take 1 mg by mouth at bedtime as needed for sleep.    [DISCONTINUED] lisinopril-hydrochlorothiazide (ZESTORETIC) 20-12.5 MG tablet Take 1 tablet by mouth daily. Pt has appt with Dr. Marlou Porch in Jan, 2023. Please keep scheduled appt for further refills   [DISCONTINUED] nitroGLYCERIN (NITROSTAT) 0.4 MG SL tablet Take 1 tab under tongue every 5 min as needed for chest pain. Call 911 if symptoms remain 5 minutes after first administration.   Current Facility-Administered Medications for the 04/18/21 encounter (Office Visit) with Jerline Pain, MD  Medication   0.9 %  sodium chloride infusion     Allergies:   Patient has no known allergies.   Social History   Socioeconomic History   Marital status: Married    Spouse name: Not on file   Number of children: 0   Years of education: Not on file   Highest education level: Not  on file  Occupational History   Occupation: retired  Tobacco Use   Smoking status: Light Smoker    Types: Cigars   Smokeless tobacco: Never   Tobacco comments:    smokes 1 cigar/wk x many years.  Vaping Use   Vaping Use: Never used  Substance and Sexual Activity   Alcohol use: Not Currently    Alcohol/week: 7.0 standard drinks    Types: 7 Glasses of wine per week    Comment: wine or beer occasional   Drug use: No   Sexual activity: Not on file  Other Topics Concern   Not on file  Social History Narrative   Lives in [place] with wife.  Exercises regularly.   Social Determinants of Health   Financial Resource Strain: Not on file  Food Insecurity: Not on file  Transportation Needs: Not on file  Physical Activity: Not on file  Stress: Not on file  Social Connections: Not on file     Family History: The patient's family history includes Alzheimer's disease in his mother; Barrett's esophagus in his father; Bladder Cancer in his brother; Esophageal cancer in his father; Hyperlipidemia in his brother; Hypertension in his father and mother; Lung cancer in his paternal grandmother. There is no history of Stomach cancer, Rectal cancer, or Colon cancer.  ROS:   Please see  the history of present illness.     All other systems reviewed and are negative.  EKGs/Labs/Other Studies Reviewed:     EKG:  EKG is  ordered today.  The ekg ordered today demonstrates 73 sinus rhythm PAC right bundle branch block  Recent Labs: No results found for requested labs within last 8760 hours.  Recent Lipid Panel    Component Value Date/Time   CHOL 213 (H) 10/17/2013 0409   TRIG 184 (H) 10/17/2013 0409   HDL 54 10/17/2013 0409   CHOLHDL 3.9 10/17/2013 0409   VLDL 37 10/17/2013 0409   LDLCALC 122 (H) 10/17/2013 0409     Risk Assessment/Calculations:              Physical Exam:    VS:  BP 140/80 (BP Location: Left Arm, Patient Position: Sitting, Cuff Size: Normal)    Pulse 73     Ht 5\' 5"  (1.651 m)    Wt 169 lb (76.7 kg)    BMI 28.12 kg/m     Wt Readings from Last 3 Encounters:  04/18/21 169 lb (76.7 kg)  12/08/19 168 lb (76.2 kg)  07/08/19 170 lb (77.1 kg)     GEN:  Well nourished, well developed in no acute distress HEENT: Normal NECK: No JVD; No carotid bruits LYMPHATICS: No lymphadenopathy CARDIAC: RRR, no murmurs, no rubs, gallops RESPIRATORY:  Clear to auscultation without rales, wheezing or rhonchi  ABDOMEN: Soft, non-tender, non-distended MUSCULOSKELETAL:  No edema; No deformity  SKIN: Warm and dry NEUROLOGIC:  Alert and oriented x 3 PSYCHIATRIC:  Normal affect   ASSESSMENT:    1. Angina at rest [facility])   2. Primary hypertension   3. Right bundle branch block   4. Pure hypercholesterolemia    PLAN:    In order of problems listed above:  Angina at rest Takes nitroglycerin.  Anxious.  Did not wish to proceed with further testing.  Echocardiogram reviewed shows normal pump function.  We will refill nitroglycerin.  He has not needed it in quite some time.  He also did not tolerate amlodipine well.  Made him feel like his neck was swelling.  HTN (hypertension) Continue with current medications lisinopril hydrochlorothiazide 20/12.5 for medical management.  Diet, exercise.  Right bundle branch block Chronic, no high risk symptoms such as syncope.  Hyperlipidemia Labs have been followed by primary physician.         Medication Adjustments/Labs and Tests Ordered: Current medicines are reviewed at length with the patient today.  Concerns regarding medicines are outlined above.  Orders Placed This Encounter  Procedures   EKG 12-Lead   Meds ordered this encounter  Medications   nitroGLYCERIN (NITROSTAT) 0.4 MG SL tablet    Sig: Take 1 tab under tongue every 5 min as needed for chest pain. Call 911 if symptoms remain 5 minutes after first administration.    Dispense:  25 tablet    Refill:  prn   lisinopril-hydrochlorothiazide  (ZESTORETIC) 20-12.5 MG tablet    Sig: Take 1 tablet by mouth daily.    Dispense:  90 tablet    Refill:  3    Patient Instructions  Medication Instructions:   Your physician recommends that you continue on your current medications as directed. Please refer to the Current Medication list given to you today.  *If you need a refill on your cardiac medications before your next appointment, please call your pharmacy*   Lab Work: [place]   If you have labs (blood work)  drawn today and your tests are completely normal, you will receive your results only by: MyChart Message (if you have MyChart) OR A paper copy in the mail If you have any lab test that is abnormal or we need to change your treatment, we will call you to review the results.   Testing/Procedures: NONE ORDERED  TODAY     Follow-Up: At [facility], you and your health needs are our priority.  As part of our continuing mission to provide you with exceptional heart care, we have created designated Provider Care Teams.  These Care Teams include your primary Cardiologist (physician) and Advanced Practice Providers (APPs -  Physician Assistants and Nurse Practitioners) who all work together to provide you with the care you need, when you need it.  We recommend signing up for the patient portal called "MyChart".  Sign up information is provided on this After Visit Summary.  MyChart is used to connect with patients for Virtual Visits (Telemedicine).  Patients are able to view lab/test results, encounter notes, upcoming appointments, etc.  Non-urgent messages can be sent to your provider as well.   To learn more about what you can do with MyChart, go to [email or web address].    Your next appointment:   1 year(s)  The format for your next appointment:   In Person  Provider:   Candee Furbish, MD     Other Instructions     Signed, Candee Furbish, MD  04/18/2021 10:23 AM    [place]

## 2021-04-18 NOTE — Assessment & Plan Note (Signed)
Labs have been followed by primary physician.

## 2021-08-21 ENCOUNTER — Encounter: Payer: Self-pay | Admitting: Cardiology

## 2021-09-05 IMAGING — DX DG CHEST 1V PORT
1 series · 2 of 2 positions shown · non-contrast
Comparison: Chest radiographs 10/23/2017.

CLINICAL DATA: 63-year-old male chest pain and shortness of breath.
No relief with nitroglycerin.

EXAM:
PORTABLE CHEST 1 VIEW

[Series 1: chest · 0.14mm/px · 2 of 2 slices shown]
[im 1/2]
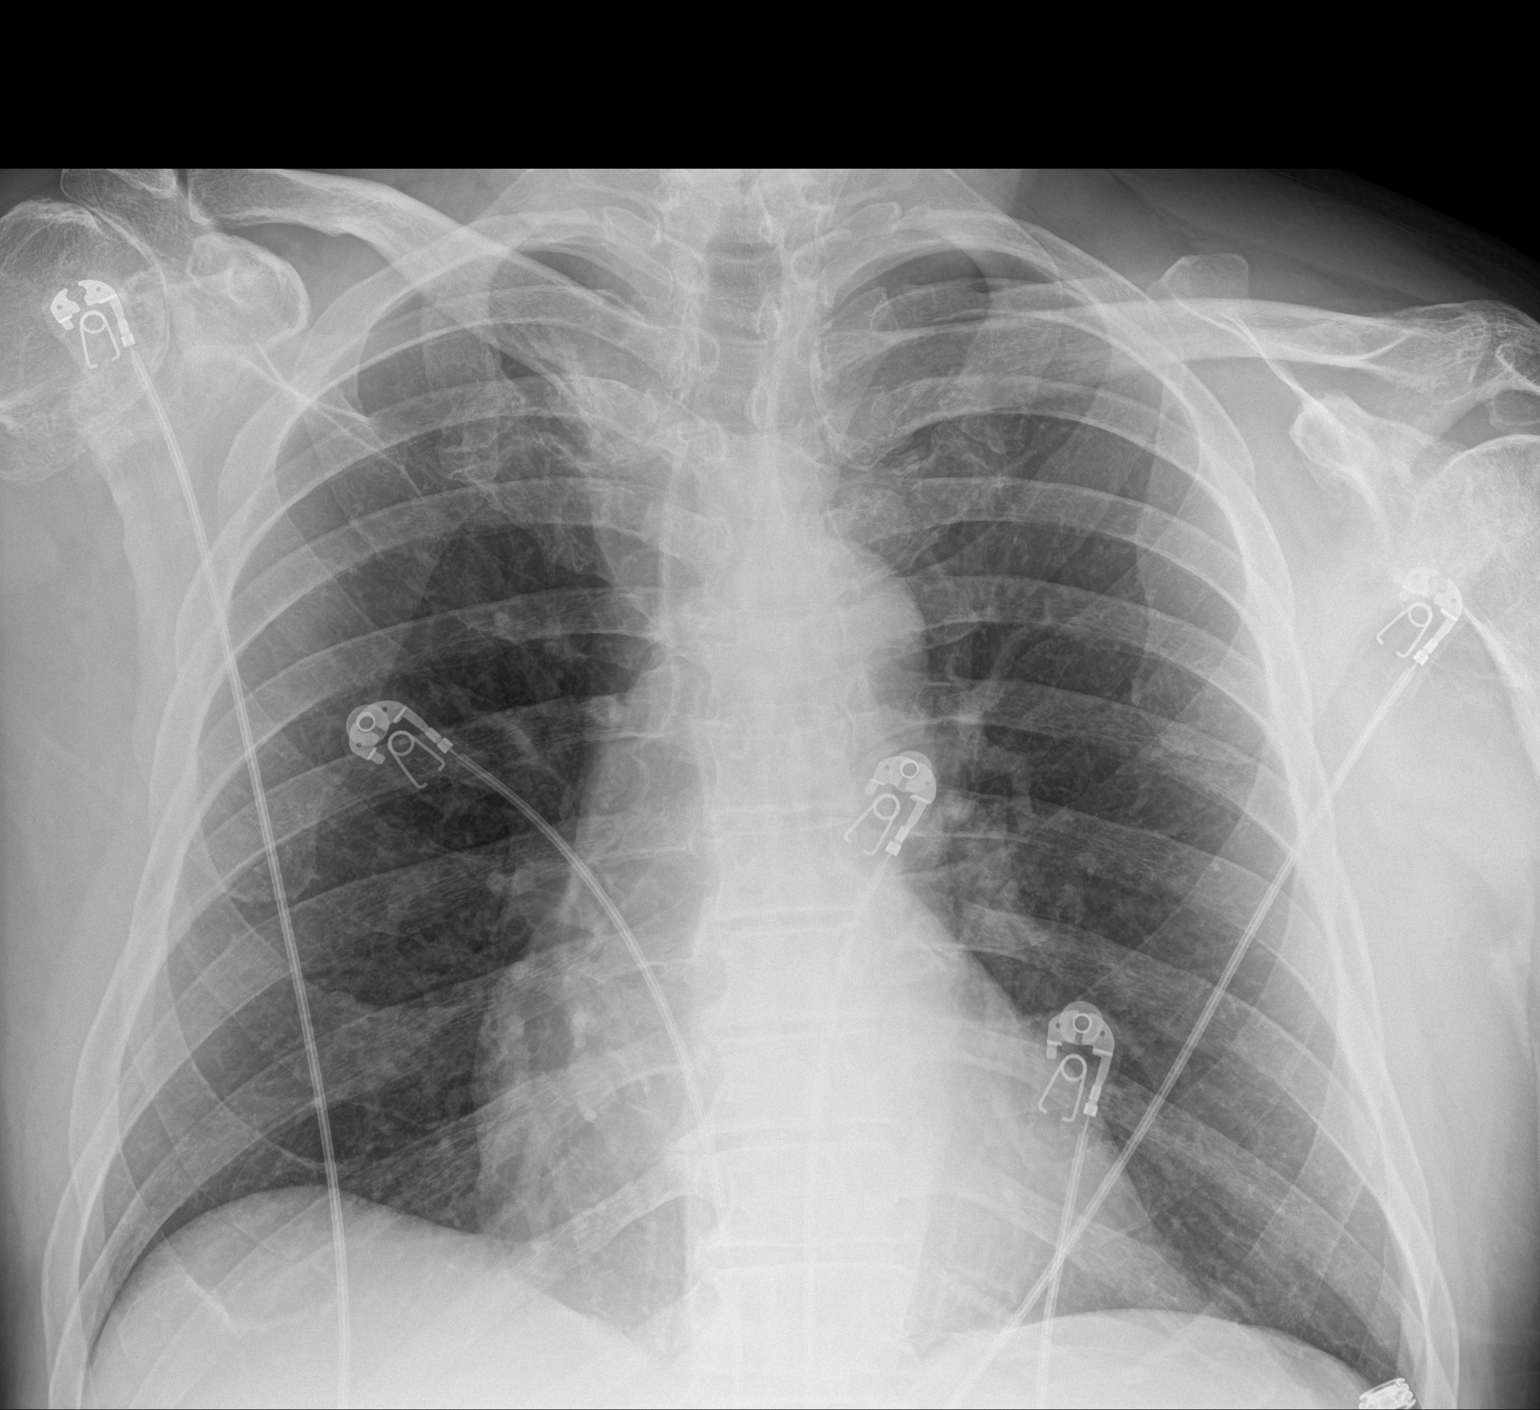
[im 2/2]
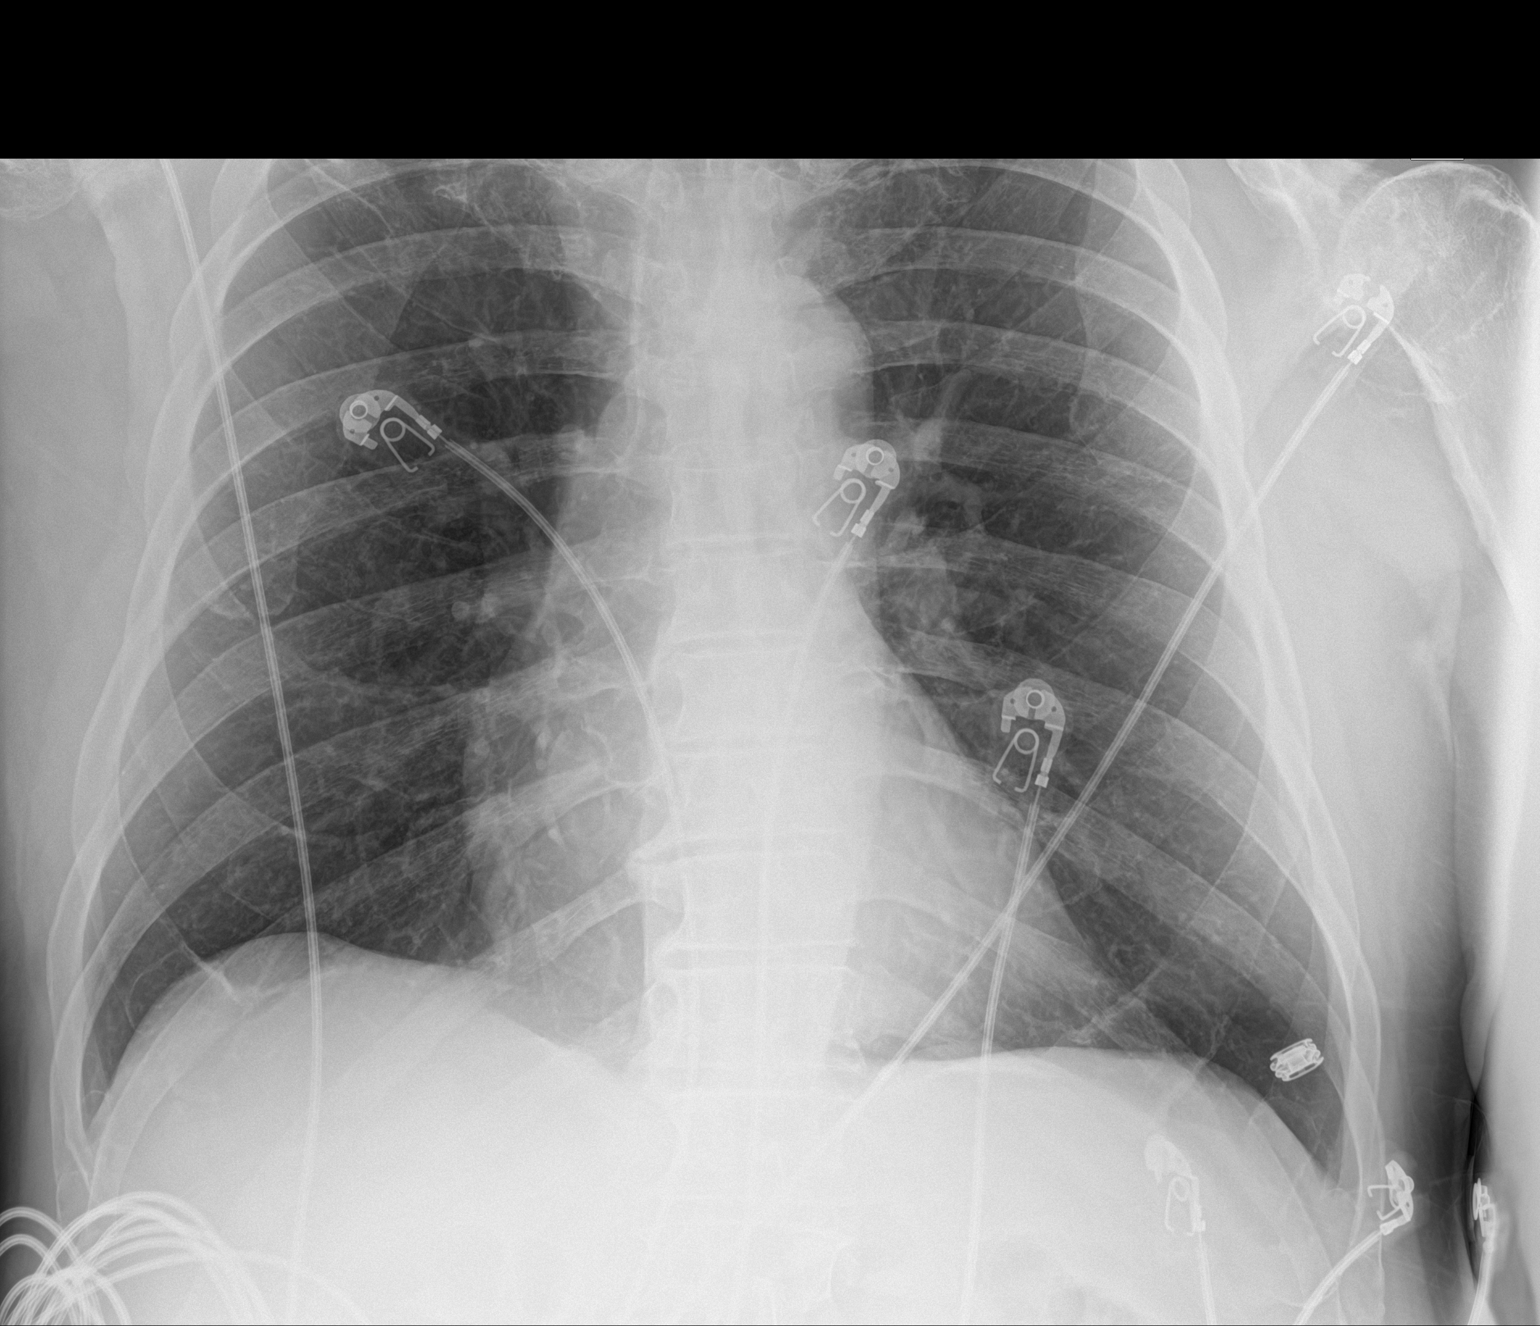

[2 of 2 positions shown; findings below may reference images not displayed]

FINDINGS: Portable AP semi upright view at 7168 hours. Lung volumes and
mediastinal contours remain normal. Visualized tracheal air column
is within normal limits. Allowing for portable technique the lungs
are clear. Chronic degenerative changes at both shoulders. No acute
osseous abnormality identified. Negative visible bowel gas pattern.
IMPRESSION: Negative portable chest.

## 2022-04-17 NOTE — Progress Notes (Unsigned)
Cardiology Office Note:    Date:  04/18/2022   ID:  Cameron Sullivan, DOB 06-07-1955, MRN [id number]  PCP:  Bryson Corona, MD  [facility] Providers Cardiologist:  Candee Furbish, MD     Referring MD: Bryson Corona, MD   Patient Profile: Chest pain Declined CCTA in past Rx w NTG  Hypertension Hyperlipidemia Right Bundle Branch Block  TTE 02/17/2019: EF 65-70, no LVH, normal RVSF, trivial TR, mild dilation of ascending aorta (38 mm)   Dilated ascending aorta (38 mm)  History of Present Illness:   Cameron Sullivan is a 67 y.o. male with the above problem list.  He was last seen by Dr. Marlou Porch 04/18/2021. He returns for f/u of chest pain, hypertension. He is here alone. He had shingles recently. This resolved. Since then, he has had urticaria that is pruritic. He has not been able to sleep. His PCP has tx him with several different agents. He remains active and exercises on a regular basis. He has not had chest pain, shortness of breath, syncope. He has not had to use NTG.   EKG: Patient declined today due to rash     Reviewed and updated this encounter:  Tobacco  Allergies  Meds  Problems  Med Hx  Surg Hx  Fam Hx     Review of Systems  Cardiovascular:  Negative for claudication.    Labs/Other Test Reviewed:   Recent Labs: No results found for requested labs within last 365 days.   Recent Lipid Panel No results for input(s): "CHOL", "TRIG", "HDL", "VLDL", "LDLCALC", "LDLDIRECT" in the last 8760 hours.   Risk Assessment/Calculations/Metrics:         HYPERTENSION CONTROL Vitals:   04/18/22 1124 04/18/22 1203  BP: (!) 180/110 (!) 164/94    The patient's blood pressure is elevated above target today.  In order to address the patient's elevated BP: Blood pressure will be monitored at home to determine if medication changes need to be made.      Physical Exam:   VS:  BP (!) 164/94   Pulse 84   Ht 5\' 5"  (1.651 m)   Wt 174 lb 6.4 oz (79.1 kg)   SpO2 99%   BMI  29.02 kg/m    Wt Readings from Last 3 Encounters:  04/18/22 174 lb 6.4 oz (79.1 kg)  04/18/21 169 lb (76.7 kg)  12/08/19 168 lb (76.2 kg)    Constitutional:      Appearance: Healthy appearance. Not in distress.  Neck:     Vascular: No carotid bruit. JVD normal.  Pulmonary:     Effort: Pulmonary effort is normal.     Breath sounds: Normal breath sounds. No wheezing. No rales.  Cardiovascular:     Normal rate. Regular rhythm. Normal S1. Normal S2.      Murmurs: There is no murmur.  Edema:    Peripheral edema absent.  Abdominal:     Palpations: Abdomen is soft.         ASSESSMENT & PLAN:   Angina at rest He has not had a recurrence of chest pain. He is quite active without symptoms. He likes to keep NTG on hand just in case.   HTN (hypertension) He is currently on Lisinopril/HCTZ 20/12/5 mg once daily. He could not tolerate Amlodipine in the past. His BP is uncontrolled. This may be due to the discomfort from his rash. He will monitor it over the next 2 weeks and send readings via MyChart. If BP is above  goal, consider adding beta-blocker vs increasing dose of Lisinopril.   Mild dilation of ascending aorta (HCC) 38 mm on echocardiogram in 2020. Consider arranging repeat echocardiogram at next OV.             Dispo:  Return in about 1 year (around 04/19/2023) for Routine Follow Up w/ Dr. Marlou Porch.   Signed, Richardson Dopp, PA-C  04/18/2022 12:08 PM    [facility] [address], [place], [place]  [address] Phone: [phone]; Fax: [phone]

## 2022-04-18 ENCOUNTER — Ambulatory Visit: Payer: Medicare Other | Attending: Physician Assistant | Admitting: Physician Assistant

## 2022-04-18 ENCOUNTER — Encounter: Payer: Self-pay | Admitting: Physician Assistant

## 2022-04-18 VITALS — BP 164/94 | HR 84 | Ht 65.0 in | Wt 174.4 lb

## 2022-04-18 DIAGNOSIS — I2089 Other forms of angina pectoris: Secondary | ICD-10-CM | POA: Diagnosis not present

## 2022-04-18 DIAGNOSIS — I10 Essential (primary) hypertension: Secondary | ICD-10-CM

## 2022-04-18 DIAGNOSIS — I77810 Thoracic aortic ectasia: Secondary | ICD-10-CM | POA: Diagnosis not present

## 2022-04-18 MED ORDER — LISINOPRIL-HYDROCHLOROTHIAZIDE 20-12.5 MG PO TABS: 1.0000 | ORAL_TABLET | Freq: Every day | ORAL | 3 refills | Status: DC

## 2022-04-18 MED ORDER — NITROGLYCERIN 0.4 MG SL SUBL: SUBLINGUAL_TABLET | SUBLINGUAL | 99 refills | Status: DC

## 2022-04-18 NOTE — Assessment & Plan Note (Signed)
He is currently on Lisinopril/HCTZ 20/12/5 mg once daily. He could not tolerate Amlodipine in the past. His BP is uncontrolled. This may be due to the discomfort from his rash. He will monitor it over the next 2 weeks and send readings via MyChart. If BP is above goal, consider adding beta-blocker vs increasing dose of Lisinopril.

## 2022-04-18 NOTE — Assessment & Plan Note (Signed)
He has not had a recurrence of chest pain. He is quite active without symptoms. He likes to keep NTG on hand just in case.

## 2022-04-18 NOTE — Assessment & Plan Note (Signed)
38 mm on echocardiogram in 2020. Consider arranging repeat echocardiogram at next OV.

## 2022-04-18 NOTE — Patient Instructions (Signed)
Medication Instructions:  Your physician recommends that you continue on your current medications as directed. Please refer to the Current Medication list given to you today.  *If you need a refill on your cardiac medications before your next appointment, please call your pharmacy*   Lab Work: None ordered  If you have labs (blood work) drawn today and your tests are completely normal, you will receive your results only by: [place] (if you have MyChart) OR A paper copy in the mail If you have any lab test that is abnormal or we need to change your treatment, we will call you to review the results.   Testing/Procedures: None ordered   Follow-Up: At [facility], you and your health needs are our priority.  As part of our continuing mission to provide you with exceptional heart care, we have created designated Provider Care Teams.  These Care Teams include your primary Cardiologist (physician) and Advanced Practice Providers (APPs -  Physician Assistants and Nurse Practitioners) who all work together to provide you with the care you need, when you need it.  We recommend signing up for the patient portal called "MyChart".  Sign up information is provided on this After Visit Summary.  MyChart is used to connect with patients for Virtual Visits (Telemedicine).  Patients are able to view lab/test results, encounter notes, upcoming appointments, etc.  Non-urgent messages can be sent to your provider as well.   To learn more about what you can do with MyChart, go to [email or web address].    Your next appointment:   1 year(s)  Provider:   Candee Furbish, MD     Other Instructions Your physician has requested that you regularly monitor and record your blood pressure readings at home. Please use the same machine at the same time of day to check your readings and record them to bring to your follow-up visit.   Please monitor blood pressures and keep a log of your readings for 2  weeks then send a mychart message with them.    Make sure to check 2 hours after your medications.    AVOID these things for 30 minutes before checking your blood pressure: No Drinking caffeine. No Drinking alcohol. No Eating. No Smoking. No Exercising.   Five minutes before checking your blood pressure: Pee. Sit in a dining chair. Avoid sitting in a soft couch or armchair. Be quiet. Do not talk

## 2023-04-24 ENCOUNTER — Ambulatory Visit: Payer: Medicare Other | Attending: Physician Assistant | Admitting: Physician Assistant

## 2023-04-24 ENCOUNTER — Encounter: Payer: Self-pay | Admitting: Physician Assistant

## 2023-04-24 VITALS — BP 170/90 | HR 79 | Resp 16 | Ht 65.0 in | Wt 172.0 lb

## 2023-04-24 DIAGNOSIS — I10 Essential (primary) hypertension: Secondary | ICD-10-CM | POA: Diagnosis not present

## 2023-04-24 DIAGNOSIS — I4510 Unspecified right bundle-branch block: Secondary | ICD-10-CM | POA: Diagnosis not present

## 2023-04-24 DIAGNOSIS — I2089 Other forms of angina pectoris: Secondary | ICD-10-CM

## 2023-04-24 DIAGNOSIS — E7800 Pure hypercholesterolemia, unspecified: Secondary | ICD-10-CM | POA: Diagnosis not present

## 2023-04-24 DIAGNOSIS — I77810 Thoracic aortic ectasia: Secondary | ICD-10-CM

## 2023-04-24 MED ORDER — LISINOPRIL-HYDROCHLOROTHIAZIDE 20-12.5 MG PO TABS: 1.0000 | ORAL_TABLET | Freq: Every day | ORAL | 3 refills | Status: AC

## 2023-04-24 MED ORDER — NITROGLYCERIN 0.4 MG SL SUBL: SUBLINGUAL_TABLET | SUBLINGUAL | 99 refills | Status: DC

## 2023-04-24 NOTE — Patient Instructions (Signed)
 Medication Instructions:  Your physician recommends that you continue on your current medications as directed. Please refer to the Current Medication list given to you today.  *If you need a refill on your cardiac medications before your next appointment, please call your pharmacy*   Lab Work: None ordered  If you have labs (blood work) drawn today and your tests are completely normal, you will receive your results only by: MyChart Message (if you have MyChart) OR A paper copy in the mail If you have any lab test that is abnormal or we need to change your treatment, we will call you to review the results.   Testing/Procedures: Your physician has requested that you have an echocardiogram. Echocardiography is a painless test that uses sound waves to create images of your heart. It provides your doctor with information about the size and shape of your heart and how well your heart's chambers and valves are working. This procedure takes approximately one hour. There are no restrictions for this procedure. Please do NOT wear cologne, perfume, aftershave, or lotions (deodorant is allowed). Please arrive 15 minutes prior to your appointment time.  Please note: We ask at that you not bring children with you during ultrasound (echo/ vascular) testing. Due to room size and safety concerns, children are not allowed in the ultrasound rooms during exams. Our front office staff cannot provide observation of children in our lobby area while testing is being conducted. An adult accompanying a patient to their appointment will only be allowed in the ultrasound room at the discretion of the ultrasound technician under special circumstances. We apologize for any inconvenience.    Follow-Up: At [facility] you and your health needs are our priority.  As part of our continuing mission to provide you with exceptional heart care, we have created designated Provider Care Teams.  These Care Teams include  your primary Cardiologist (physician) and Advanced Practice Providers (APPs -  Physician Assistants and Nurse Practitioners) who all work together to provide you with the care you need, when you need it.  We recommend signing up for the patient portal called MyChart.  Sign up information is provided on this After Visit Summary.  MyChart is used to connect with patients for Virtual Visits (Telemedicine).  Patients are able to view lab/test results, encounter notes, upcoming appointments, etc.  Non-urgent messages can be sent to your provider as well.   To learn more about what you can do with MyChart, go to [email or web address].    Your next appointment:   1 year(s)  Provider:   Oneil Parchment, MD  or Glendia Ferrier, PA-C         Other Instructions  Your physician has requested that you regularly monitor and record your blood pressure readings at home. Please use the same machine at the same time of day to check your readings and record them to bring to your follow-up visit.   Please monitor blood pressures and keep a log of your readings for 1 weeks then send them to me through mychart.    Make sure to check 2 hours after your medications.    AVOID these things for 30 minutes before checking your blood pressure: No Drinking caffeine. No Drinking alcohol. No Eating. No Smoking. No Exercising.   Five minutes before checking your blood pressure: Pee. Sit in a dining chair. Avoid sitting in a soft couch or armchair. Be quiet. Do not talk      1st Floor: - Lobby - Registration  -  Pharmacy  - Lab - Cafe  2nd Floor: - PV Lab - Diagnostic Testing (echo, CT, nuclear med)  3rd Floor: - Vacant  4th Floor: - TCTS (cardiothoracic surgery) - AFib Clinic - Structural Heart Clinic - Vascular Surgery  - Vascular Ultrasound  5th Floor: - HeartCare Cardiology (general and EP) - Clinical Pharmacy for coumadin, hypertension, lipid, weight-loss medications, and med management  appointments    Valet parking services will be available as well.

## 2023-04-24 NOTE — Assessment & Plan Note (Signed)
 He has a long hx of chest pain. His symptoms seem to mostly be related to meals. He does mention some symptoms that are severe enough that he has considered NTG. He has declined CCTA in the past. I offered stress testing vs CCTA today. I suggested Myocardial PET rather than exercise spect. He declines and would rather do an exercise test. He cannot do this now b/c of his metatarsalgia. He will let me know if he changes his mind.  -Refill NTG prn -Follow up 1 year.

## 2023-04-24 NOTE — Assessment & Plan Note (Signed)
 Managed by PCP

## 2023-04-24 NOTE — Progress Notes (Signed)
 Cardiology Office Note:    Date:  04/24/2023  ID:  Cameron Sullivan, DOB 1955/10/14, MRN [id number] PCP: Glennette Curlene HERO, MD  [facility] Providers Cardiologist:  Oneil Parchment, MD       Patient Profile:      Chest pain Declined CCTA in past Rx w NTG  Hypertension Hyperlipidemia Right Bundle Branch Block  TTE 02/17/2019: EF 65-70, no LVH, normal RVSF, trivial TR, mild dilation of ascending aorta (38 mm)   Dilated ascending aorta (38 mm)       Cameron Sullivan is a 68 y.o. male who returns for follow up of chest pain. He was last seen in 03/2022.   Discussed the use of AI scribe software for clinical note transcription with the patient, who gave verbal consent to proceed.  History of Present Illness   Blood pressure readings at home are typically in the 130s/80s range. He believes his home blood pressure monitor is accurate. He discusses a history of chest pain, which he attributes to a hiatal hernia rather than cardiac issues. The pain occurs after meals, particularly when sitting with poor posture, and is relieved by burping. He has not had significant chest pain since changing his eating habits and posture, with symptoms decreasing significantly since 2019. Both parents and his brother had hiatal hernia. He mentions having metatarsalgia, which affects his walking speed due to discomfort in the balls of his feet. He has seen his primary care physician for this issue. He maintains an active lifestyle, walking two and a half miles daily with his dog and engaging in resistance training with light weights. He has not experienced exertional chest pain, and he has not needed to use nitroglycerin . Occasional chest discomfort related to his hiatal hernia occurs once a month and resolves quickly. No leg pain but swelling in his feet in the morning resolves with activity.      Review of Systems  Cardiovascular:  Negative for claudication.  -See HPI     Studies Reviewed:   EKG  Interpretation Date/Time:  Wednesday April 24 2023 11:31:38 EST Ventricular Rate:  73 PR Interval:  158 QRS Duration:  130 QT Interval:  414 QTC Calculation: 456 R Axis:   11  Text Interpretation: Normal sinus rhythm Right bundle branch block No significant change since last tracing Confirmed by Lelon Hamilton [phone]) on 04/24/2023 11:54:45 AM   Results   Labs reviewed from Primary Care 04/24/22: Hgb 15, SCr 1.20, K 3.9, ALT 17, LDL 139, HDL 64, Tg 87, TC 218        Risk Assessment/Calculations:     HYPERTENSION CONTROL Vitals:   04/24/23 1128 04/24/23 1234  BP: (!) 140/98 (!) 170/90    The patient's blood pressure is elevated above target today.  In order to address the patient's elevated BP: Blood pressure will be monitored at home to determine if medication changes need to be made.; The blood pressure is usually elevated in clinic.  Blood pressures monitored at home have been optimal.          Physical Exam:   VS:  BP (!) 170/90   Pulse 79   Resp 16   Ht 5' 5 (1.651 m)   Wt 172 lb (78 kg)   SpO2 96%   BMI 28.62 kg/m    Wt Readings from Last 3 Encounters:  04/24/23 172 lb (78 kg)  04/18/22 174 lb 6.4 oz (79.1 kg)  04/18/21 169 lb (76.7 kg)    Constitutional:  Appearance: Healthy appearance. Not in distress.  Neck:     Vascular: No carotid bruit. JVD normal.  Pulmonary:     Breath sounds: Normal breath sounds. No wheezing. No rales.  Cardiovascular:     Normal rate. Regular rhythm.     Murmurs: There is no murmur.  Edema:    Peripheral edema absent.  Abdominal:     Palpations: Abdomen is soft.         Assessment and Plan:   Assessment & Plan Angina at rest [facility]) He has a long hx of chest pain. His symptoms seem to mostly be related to meals. He does mention some symptoms that are severe enough that he has considered NTG. He has declined CCTA in the past. I offered stress testing vs CCTA today. I suggested Myocardial PET rather than exercise spect. He  declines and would rather do an exercise test. He cannot do this now b/c of his metatarsalgia. He will let me know if he changes his mind.  -Refill NTG prn -Follow up 1 year. Primary hypertension BP markedly elevated today. He notes a hx of white coat HTN and normal BPs at home. -Continue Lisinopril /hydrochlorothiazide  20/12.5 mg once daily  -Monitor BP x 1 week and send to me for review -If home BP is > 130/80, consider Amlodipine  Pure hypercholesterolemia Managed by PCP. Right bundle branch block Chronic. No EKG change today.  Mild dilation of ascending aorta (HCC) TTE in 2020 38 mm. Arrange repeat echocardiogram.      Dispo:  Return in about 1 year (around 04/23/2024) for Routine Follow Up, or Glendia Ferrier, PA-C.  Signed, Glendia Ferrier, PA-C

## 2023-04-24 NOTE — Assessment & Plan Note (Signed)
 BP markedly elevated today. He notes a hx of "white coat HTN" and normal BPs at home. -Continue Lisinopril /hydrochlorothiazide  20/12.5 mg once daily  -Monitor BP x 1 week and send to me for review -If home BP is > 130/80, consider Amlodipine 

## 2023-04-24 NOTE — Assessment & Plan Note (Signed)
 Chronic. No EKG change today.

## 2023-04-24 NOTE — Assessment & Plan Note (Signed)
 TTE in 2020 38 mm. Arrange repeat echocardiogram.

## 2023-04-29 NOTE — Progress Notes (Signed)
 Order(s) created erroneously. Erroneous order ID: [id number]  Order moved by: STONEY MAGALENE FALCON  Order move date/time: 04/29/2023 11:34 AM  Source Patient: [id number]  Source Contact: 04/24/2023  Destination Patient: [id number]  Destination Contact: 08/29/2022

## 2023-05-16 ENCOUNTER — Telehealth (HOSPITAL_COMMUNITY): Payer: Self-pay | Admitting: Physician Assistant

## 2023-05-16 NOTE — Telephone Encounter (Signed)
 Patient called and cancelled echocardiogram. He doesn't feel like he needs at the moment and may all back in the future  to reschedule. Order will be removed from the echo Wq and if patient calls back we will reinstate the order. Thank you.

## 2023-05-21 ENCOUNTER — Ambulatory Visit (HOSPITAL_COMMUNITY): Payer: Medicare Other

## 2023-07-25 ENCOUNTER — Telehealth: Payer: Self-pay | Admitting: Cardiology

## 2023-07-25 NOTE — Telephone Encounter (Signed)
 Patient stated he now wants to have the echocardiogram and wants to get orders for this test.

## 2023-07-25 NOTE — Telephone Encounter (Signed)
 Spoke to patient he stated he cancelled echo that was scheduled in March,but has decided he needs to have done.Advised scheduler will call back with appointment.

## 2023-08-05 ENCOUNTER — Telehealth: Payer: Self-pay | Admitting: Cardiology

## 2023-08-05 NOTE — Telephone Encounter (Signed)
 Patient wants a call back to confirm his echocardiogram has been approved by his insurance.  Patient stated can send message through MyChart.

## 2023-08-19 ENCOUNTER — Telehealth: Payer: Self-pay | Admitting: Cardiology

## 2023-08-19 NOTE — Telephone Encounter (Signed)
 [employer] calling because pt is having a Echo done on 6/13 and we haven't started prior auth. ??

## 2023-08-30 ENCOUNTER — Ambulatory Visit (HOSPITAL_COMMUNITY)
Admission: RE | Admit: 2023-08-30 | Discharge: 2023-08-30 | Disposition: A | Discharge status: Home / Self Care | Source: Ambulatory Visit | Attending: Cardiology | Admitting: Cardiology

## 2023-08-30 DIAGNOSIS — I77810 Thoracic aortic ectasia: Secondary | ICD-10-CM

## 2023-08-30 DIAGNOSIS — E785 Hyperlipidemia, unspecified: Secondary | ICD-10-CM | POA: Diagnosis not present

## 2023-08-30 DIAGNOSIS — I340 Nonrheumatic mitral (valve) insufficiency: Secondary | ICD-10-CM

## 2023-08-30 DIAGNOSIS — I10 Essential (primary) hypertension: Secondary | ICD-10-CM | POA: Diagnosis not present

## 2023-08-30 LAB — ECHOCARDIOGRAM COMPLETE
Area-P 1/2: 3.45 cm2
S' Lateral: 3.2 cm

## 2023-09-02 ENCOUNTER — Other Ambulatory Visit (HOSPITAL_BASED_OUTPATIENT_CLINIC_OR_DEPARTMENT_OTHER): Payer: Self-pay | Admitting: *Deleted

## 2023-09-02 ENCOUNTER — Ambulatory Visit: Payer: Self-pay | Admitting: Physician Assistant

## 2023-09-02 ENCOUNTER — Encounter: Payer: Self-pay | Admitting: Physician Assistant

## 2023-09-02 DIAGNOSIS — I340 Nonrheumatic mitral (valve) insufficiency: Secondary | ICD-10-CM | POA: Insufficient documentation

## 2023-09-02 DIAGNOSIS — I77810 Thoracic aortic ectasia: Secondary | ICD-10-CM

## 2023-12-27 ENCOUNTER — Telehealth: Payer: Self-pay | Admitting: Cardiology

## 2023-12-27 NOTE — Telephone Encounter (Signed)
 PCP manages HLD.  No recent labs on pt's chart.  Left message to contact PCP with further questions/concerns.

## 2023-12-27 NOTE — Telephone Encounter (Signed)
 Shilpi with [employer] Clinical Dept. Calling with some clinical questions regarding to patient. Pt may benefit from statin drug and would like to confirm diagnosis.   Shilpi [employer] [phone] ext.[phone]

## 2024-04-21 ENCOUNTER — Telehealth: Payer: Self-pay | Admitting: Cardiology

## 2024-04-21 MED ORDER — NITROGLYCERIN 0.4 MG SL SUBL: SUBLINGUAL_TABLET | SUBLINGUAL | 99 refills | Status: AC

## 2024-04-21 NOTE — Telephone Encounter (Signed)
 Pt scheduled 07/01/24, refill sent.

## 2024-05-13 ENCOUNTER — Ambulatory Visit: Admitting: Physician Assistant

## 2024-07-01 ENCOUNTER — Ambulatory Visit: Admitting: Physician Assistant
